# Patient Record
Sex: Female | Born: 1960 | Race: White | Hispanic: No | Marital: Married | Smoking: Never smoker
Health system: Southern US, Community
[De-identification: ages and names within clinical notes are randomized; demographics above are authoritative.]

## PROBLEM LIST (undated history)

## (undated) DIAGNOSIS — C4490 Unspecified malignant neoplasm of skin, unspecified: Secondary | ICD-10-CM

## (undated) DIAGNOSIS — T7840XA Allergy, unspecified, initial encounter: Secondary | ICD-10-CM

## (undated) DIAGNOSIS — G43909 Migraine, unspecified, not intractable, without status migrainosus: Secondary | ICD-10-CM

## (undated) DIAGNOSIS — M810 Age-related osteoporosis without current pathological fracture: Secondary | ICD-10-CM

## (undated) HISTORY — PX: COSMETIC SURGERY: SHX468

## (undated) HISTORY — DX: Allergy, unspecified, initial encounter: T78.40XA

## (undated) HISTORY — DX: Unspecified malignant neoplasm of skin, unspecified: C44.90

## (undated) HISTORY — DX: Migraine, unspecified, not intractable, without status migrainosus: G43.909

## (undated) HISTORY — DX: Age-related osteoporosis without current pathological fracture: M81.0

## (undated) HISTORY — PX: ANKLE SURGERY: SHX546

---

## 2009-01-31 HISTORY — PX: OTHER SURGICAL HISTORY: SHX169

## 2012-01-15 HISTORY — PX: COLONOSCOPY: SHX174

## 2013-05-07 LAB — HEPATIC FUNCTION PANEL
ALT: 20 (ref 7–35)
AST: 23 (ref 13–35)
Alkaline Phosphatase: 84 (ref 25–125)
Bilirubin, Total: 0.4

## 2013-05-07 LAB — COMPREHENSIVE METABOLIC PANEL: GFR calc non Af Amer: 95

## 2013-05-07 LAB — LIPID PANEL
Cholesterol: 137 (ref 0–200)
HDL: 53 (ref 35–70)
LDL Cholesterol: 74
Triglycerides: 50 (ref 40–160)

## 2013-05-07 LAB — BASIC METABOLIC PANEL
BUN: 16 (ref 4–21)
CO2: 26 — AB (ref 13–22)
Chloride: 104 (ref 99–108)
Creatinine: 0.7 (ref 0.5–1.1)
Glucose: 86
Potassium: 4.6 (ref 3.4–5.3)
Sodium: 142 (ref 137–147)

## 2013-05-07 LAB — CBC AND DIFFERENTIAL
HCT: 40 (ref 36–46)
Hemoglobin: 12.8 (ref 12.0–16.0)
Platelets: 276 (ref 150–399)
WBC: 3.8

## 2013-05-07 LAB — CBC: RBC: 4.19 (ref 3.87–5.11)

## 2013-05-07 LAB — TSH: TSH: 2.85 (ref 0.41–5.90)

## 2016-01-01 HISTORY — PX: MENISCUS REPAIR: SHX5179

## 2016-08-16 LAB — LIPID PANEL
Cholesterol: 131 (ref 0–200)
HDL: 59 (ref 35–70)
LDL Cholesterol: 65
TRIGLYCERIDES: 37 — AB (ref 40–160)

## 2016-08-16 LAB — BASIC METABOLIC PANEL
BUN: 15 (ref 4–21)
Creatinine: 0.7 (ref 0.5–1.1)
Glucose: 78
Potassium: 4.5 (ref 3.4–5.3)
SODIUM: 144 (ref 137–147)

## 2016-08-16 LAB — HEPATIC FUNCTION PANEL
ALK PHOS: 88 (ref 25–125)
ALT: 19 (ref 7–35)
AST: 28 (ref 13–35)
BILIRUBIN, TOTAL: 0.6

## 2016-10-04 ENCOUNTER — Ambulatory Visit (INDEPENDENT_AMBULATORY_CARE_PROVIDER_SITE_OTHER): Payer: BLUE CROSS/BLUE SHIELD | Admitting: Internal Medicine

## 2016-10-04 ENCOUNTER — Encounter: Payer: Self-pay | Admitting: Internal Medicine

## 2016-10-04 VITALS — BP 94/58 | HR 65 | Ht 63.0 in | Wt 106.8 lb

## 2016-10-04 DIAGNOSIS — R7303 Prediabetes: Secondary | ICD-10-CM | POA: Insufficient documentation

## 2016-10-04 DIAGNOSIS — S40862D Insect bite (nonvenomous) of left upper arm, subsequent encounter: Secondary | ICD-10-CM

## 2016-10-04 DIAGNOSIS — R739 Hyperglycemia, unspecified: Secondary | ICD-10-CM | POA: Insufficient documentation

## 2016-10-04 DIAGNOSIS — Z85820 Personal history of malignant melanoma of skin: Secondary | ICD-10-CM | POA: Insufficient documentation

## 2016-10-04 DIAGNOSIS — W57XXXD Bitten or stung by nonvenomous insect and other nonvenomous arthropods, subsequent encounter: Secondary | ICD-10-CM

## 2016-10-04 DIAGNOSIS — J3089 Other allergic rhinitis: Secondary | ICD-10-CM | POA: Insufficient documentation

## 2016-10-04 DIAGNOSIS — G43C0 Periodic headache syndromes in child or adult, not intractable: Secondary | ICD-10-CM

## 2016-10-04 DIAGNOSIS — Z1231 Encounter for screening mammogram for malignant neoplasm of breast: Secondary | ICD-10-CM | POA: Diagnosis not present

## 2016-10-04 DIAGNOSIS — M1712 Unilateral primary osteoarthritis, left knee: Secondary | ICD-10-CM | POA: Insufficient documentation

## 2016-10-04 DIAGNOSIS — W57XXXA Bitten or stung by nonvenomous insect and other nonvenomous arthropods, initial encounter: Secondary | ICD-10-CM

## 2016-10-04 DIAGNOSIS — G43009 Migraine without aura, not intractable, without status migrainosus: Secondary | ICD-10-CM | POA: Insufficient documentation

## 2016-10-04 DIAGNOSIS — S40862A Insect bite (nonvenomous) of left upper arm, initial encounter: Secondary | ICD-10-CM | POA: Insufficient documentation

## 2016-10-04 DIAGNOSIS — Z1239 Encounter for other screening for malignant neoplasm of breast: Secondary | ICD-10-CM

## 2016-10-04 HISTORY — DX: Unilateral primary osteoarthritis, left knee: M17.12

## 2016-10-04 MED ORDER — FLUTICASONE PROPIONATE 50 MCG/ACT NA SUSP: 2.0000 | Freq: Every day | NASAL | 5 refills | Status: DC

## 2016-10-04 MED ORDER — ETODOLAC 400 MG PO TABS: 400.0000 mg | ORAL_TABLET | Freq: Every day | ORAL | 5 refills | Status: DC

## 2016-10-04 MED ORDER — RIZATRIPTAN BENZOATE 10 MG PO TABS: 10.0000 mg | ORAL_TABLET | ORAL | 5 refills | Status: DC | PRN

## 2016-10-04 NOTE — Patient Instructions (Addendum)
Schedule appt with Dermatology -  Breast Self-Awareness Breast self-awareness means being familiar with how your breasts look and feel. It involves checking your breasts regularly and reporting any changes to your health care provider. Practicing breast self-awareness is important. A change in your breasts can be a sign of a serious medical problem. Being familiar with how your breasts look and feel allows you to find any problems early, when treatment is more likely to be successful. All women should practice breast self-awareness, including women who have had breast implants. How to do a breast self-exam One way to learn what is normal for your breasts and whether your breasts are changing is to do a breast self-exam. To do a breast self-exam: Look for Changes  1. Remove all the clothing above your waist. 2. Stand in front of a mirror in a room with good lighting. 3. Put your hands on your hips. 4. Push your hands firmly downward. 5. Compare your breasts in the mirror. Look for differences between them (asymmetry), such as: ? Differences in shape. ? Differences in size. ? Puckers, dips, and bumps in one breast and not the other. 6. Look at each breast for changes in your skin, such as: ? Redness. ? Scaly areas. 7. Look for changes in your nipples, such as: ? Discharge. ? Bleeding. ? Dimpling. ? Redness. ? A change in position. Feel for Changes  Carefully feel your breasts for lumps and changes. It is best to do this while lying on your back on the floor and again while sitting or standing in the shower or tub with soapy water on your skin. Feel each breast in the following way:  Place the arm on the side of the breast you are examining above your head.  Feel your breast with the other hand.  Start in the nipple area and make  inch (2 cm) overlapping circles to feel your breast. Use the pads of your three middle fingers to do this. Apply light pressure, then medium pressure, then  firm pressure. The light pressure will allow you to feel the tissue closest to the skin. The medium pressure will allow you to feel the tissue that is a little deeper. The firm pressure will allow you to feel the tissue close to the ribs.  Continue the overlapping circles, moving downward over the breast until you feel your ribs below your breast.  Move one finger-width toward the center of the body. Continue to use the  inch (2 cm) overlapping circles to feel your breast as you move slowly up toward your collarbone.  Continue the up and down exam using all three pressures until you reach your armpit.  Write Down What You Find  Write down what is normal for each breast and any changes that you find. Keep a written record with breast changes or normal findings for each breast. By writing this information down, you do not need to depend only on memory for size, tenderness, or location. Write down where you are in your menstrual cycle, if you are still menstruating. If you are having trouble noticing differences in your breasts, do not get discouraged. With time you will become more familiar with the variations in your breasts and more comfortable with the exam. How often should I examine my breasts? Examine your breasts every month. If you are breastfeeding, the best time to examine your breasts is after a feeding or after using a breast pump. If you menstruate, the best time to examine your  breasts is 5-7 days after your period is over. During your period, your breasts are lumpier, and it may be more difficult to notice changes. When should I see my health care provider? See your health care provider if you notice:  A change in shape or size of your breasts or nipples.  A change in the skin of your breast or nipples, such as a reddened or scaly area.  Unusual discharge from your nipples.  A lump or thick area that was not there before.  Pain in your breasts.  Anything that concerns  you.  This information is not intended to replace advice given to you by your health care provider. Make sure you discuss any questions you have with your health care provider. Document Released: 01/17/2005 Document Revised: 06/25/2015 Document Reviewed: 12/07/2014 Elsevier Interactive Patient Education  [employer].

## 2016-10-04 NOTE — Progress Notes (Signed)
Date:  10/04/2016   Name:  Kristi Murphy   DOB:  [birth year]   MRN:  [id number]   Chief Complaint: Establish Care (Wants to discuss medication. ); Tick Removal (Was bitten in may and april. But every once in a while will have tick on body. Would like lyme test. ); and Hyperglycemia (2 years ago had pre-diabetes. Concerned of diabetes- tingling in hands during sleep. Would like to check this. ) Hyperglycemia  Chronicity: uncertain - was told she was pre diabetic 2 years ago. Associated symptoms include arthralgias (intermittent left knee ache with excessive activity), nausea, numbness (in hands when awakening from sleep) and vomiting. Pertinent negatives include no chest pain, chills, fatigue, fever, headaches, joint swelling, myalgias or sore throat.  Migraine   This is a recurrent problem. The current episode started more than 1 year ago. The problem occurs intermittently (every several months). The pain is located in the right unilateral region. The quality of the pain is described as stabbing and throbbing. Associated symptoms include nausea, numbness (in hands when awakening from sleep) and vomiting. Pertinent negatives include no dizziness, fever or sore throat. She has tried triptans for the symptoms. The treatment provided significant relief.   Tick Bites - she has had a number of tick bites in the past few months. [place] and also thinks her dogs carry them in. The last tick bite was on the back of her left hand she pulled it off yesterday. The area is slightly red and itchy but otherwise she is well. She denies any history of target lesions no diffuse rash myalgia headache or flulike symptoms.  S/p Left Meniscus surgery - done in December in [place]. She's been taking etodolac twice a day as needed. She wonders if there something less expensive that she could use. She has no issues with Aleve or ibuprofen.  Hx of Melanoma - patient had a melanoma removed from the back of her  right arm around 2011. She's been seeing dermatology regularly. Needs to establish with a new dermatologist here.  Breast cancer screening - patient gets mammograms yearly. Last was done in [place] and was borderline. They asked her to return for additional views but she declined due to being out of state already. She denies breast mass tenderness or nipple discharge.  Review of Systems  Constitutional: Negative for chills, fatigue and fever.  HENT: Negative for sore throat and trouble swallowing.   Eyes: Negative for visual disturbance.  Respiratory: Negative for chest tightness, shortness of breath and wheezing.   Cardiovascular: Negative for chest pain, palpitations and leg swelling.  Gastrointestinal: Positive for nausea and vomiting. Negative for blood in stool.  Endocrine: Negative for polydipsia and polyuria.  Genitourinary: Negative for dysuria.  Musculoskeletal: Positive for arthralgias (intermittent left knee ache with excessive activity). Negative for gait problem, joint swelling and myalgias.  Allergic/Immunologic: Positive for environmental allergies.  Neurological: Positive for numbness (in hands when awakening from sleep). Negative for dizziness and headaches.  Psychiatric/Behavioral: Negative for sleep disturbance. The patient is not nervous/anxious.     Patient Active Problem List   Diagnosis Date Noted  . Hyperglycemia 10/04/2016  . Tick bite of left upper arm 10/04/2016  . Hx of melanoma of skin 10/04/2016  . Periodic headache syndrome, not intractable 10/04/2016  . Environmental and seasonal allergies 10/04/2016    Prior to Admission medications   Medication Sig Start Date End Date Taking? Authorizing Provider  etodolac (LODINE) 400 MG tablet Take 400 mg  by mouth daily.   Yes [provider]  fluticasone (FLONASE) 50 MCG/ACT nasal spray Place 2 sprays into both nostrils daily.   Yes [provider]  rizatriptan (MAXALT) 10 MG tablet Take 10 mg by  mouth as needed for migraine. May repeat in 2 hours if needed   Yes [provider]    Allergies  Allergen Reactions  . Clindamycin/Lincomycin Hives    Past Surgical History:  Procedure Laterality Date  . ANKLE SURGERY     fracture  . COLONOSCOPY  01/15/2012   normal - 10 yr follow up  . melanoma Right 2011   removal-   . MENISCUS REPAIR Left 01/2016    Social History  Substance Use Topics  . Smoking status: Never Smoker  . Smokeless tobacco: Never Used  . Alcohol use No     Medication list has been reviewed and updated.  PHQ 2/9 Scores 10/04/2016  PHQ - 2 Score 0    Physical Exam  Constitutional: She is oriented to person, place, and time. She appears well-developed. No distress.  HENT:  Head: Normocephalic and atraumatic.  Cardiovascular: Normal rate, regular rhythm and normal heart sounds.   Pulmonary/Chest: Effort normal and breath sounds normal. No respiratory distress.  Musculoskeletal: Normal range of motion. She exhibits no edema.       Right knee: She exhibits normal range of motion and no effusion.       Left knee: She exhibits normal range of motion, no swelling and no effusion. No tenderness found.  Neurological: She is alert and oriented to person, place, and time.  Skin: Skin is warm and dry. No rash noted. There is erythema.     Psychiatric: She has a normal mood and affect. Her speech is normal and behavior is normal. Thought content normal.  Nursing note and vitals reviewed.   BP (!) 94/58   Pulse 65   Ht 5\' 3"  (1.6 m)   Wt 106 lb 12.8 oz (48.4 kg)   SpO2 97%   BMI 18.92 kg/m   Assessment and Plan: 1. Environmental and seasonal allergies Continue flonase - fluticasone (FLONASE) 50 MCG/ACT nasal spray; Place 2 sprays into both nostrils daily.  Dispense: 16 g; Refill: 5  2. Periodic headache syndrome, not intractable Continue triptans as needed - rizatriptan (MAXALT) 10 MG tablet; Take 1 tablet (10 mg total) by mouth as needed for  migraine. May repeat in 2 hours if needed  Dispense: 12 tablet; Refill: 5  3. Hx of melanoma of skin Schedule with new Dermatologist - Comprehensive metabolic panel - CBC with Differential/Platelet  4. Tick bite of left upper arm, subsequent encounter No evidence of infection but will check labs - B. Burgdorfi Antibodies  5. Breast cancer screening Call for appt - will likely need to request outside films - [facility] DIGITAL SCREENING BILATERAL; Future  6. Hyperglycemia Rule out DM - Hemoglobin A1c  7. Primary osteoarthritis of left knee Continue etodolac Or can substitute Aleve 2 tabs bid PRN  Meds ordered this encounter  Medications  . rizatriptan (MAXALT) 10 MG tablet    Sig: Take 1 tablet (10 mg total) by mouth as needed for migraine. May repeat in 2 hours if needed    Dispense:  12 tablet    Refill:  5  . etodolac (LODINE) 400 MG tablet    Sig: Take 1 tablet (400 mg total) by mouth daily.    Dispense:  30 tablet    Refill:  5  . fluticasone (  FLONASE) 50 MCG/ACT nasal spray    Sig: Place 2 sprays into both nostrils daily.    Dispense:  16 g    Refill:  5    Partially dictated using Editor, commissioning. Any errors are unintentional.  Halina Maidens, MD [facility]  10/04/2016

## 2016-10-05 LAB — COMPREHENSIVE METABOLIC PANEL
A/G RATIO: 1.7 (ref 1.2–2.2)
ALT: 23 IU/L (ref 0–32)
AST: 26 IU/L (ref 0–40)
Albumin: 4 g/dL (ref 3.5–5.5)
Alkaline Phosphatase: 96 IU/L (ref 39–117)
BILIRUBIN TOTAL: 0.2 mg/dL (ref 0.0–1.2)
BUN / CREAT RATIO: 23 (ref 9–23)
BUN: 19 mg/dL (ref 6–24)
CHLORIDE: 101 mmol/L (ref 96–106)
CO2: 26 mmol/L (ref 20–29)
Calcium: 9.4 mg/dL (ref 8.7–10.2)
Creatinine, Ser: 0.81 mg/dL (ref 0.57–1.00)
GFR calc Af Amer: 93 mL/min/{1.73_m2} (ref >59)
GFR calc non Af Amer: 81 mL/min/{1.73_m2} (ref >59)
GLOBULIN, TOTAL: 2.4 g/dL (ref 1.5–4.5)
Glucose: 89 mg/dL (ref 65–99)
POTASSIUM: 4.5 mmol/L (ref 3.5–5.2)
Sodium: 142 mmol/L (ref 134–144)
Total Protein: 6.4 g/dL (ref 6.0–8.5)

## 2016-10-05 LAB — CBC WITH DIFFERENTIAL/PLATELET
BASOS ABS: 0.1 10*3/uL (ref 0.0–0.2)
Basos: 1 %
EOS (ABSOLUTE): 0.1 10*3/uL (ref 0.0–0.4)
Eos: 3 %
Hematocrit: 38.9 % (ref 34.0–46.6)
Hemoglobin: 12.3 g/dL (ref 11.1–15.9)
IMMATURE GRANS (ABS): 0 10*3/uL (ref 0.0–0.1)
Immature Granulocytes: 0 %
LYMPHS: 43 %
Lymphocytes Absolute: 1.9 10*3/uL (ref 0.7–3.1)
MCH: 29.8 pg (ref 26.6–33.0)
MCHC: 31.6 g/dL (ref 31.5–35.7)
MCV: 94 fL (ref 79–97)
MONOS ABS: 0.5 10*3/uL (ref 0.1–0.9)
Monocytes: 12 %
NEUTROS ABS: 1.8 10*3/uL (ref 1.4–7.0)
Neutrophils: 41 %
PLATELETS: 284 10*3/uL (ref 150–379)
RBC: 4.13 x10E6/uL (ref 3.77–5.28)
RDW: 13.5 % (ref 12.3–15.4)
WBC: 4.3 10*3/uL (ref 3.4–10.8)

## 2016-10-05 LAB — HEMOGLOBIN A1C
Est. average glucose Bld gHb Est-mCnc: 111 mg/dL
HEMOGLOBIN A1C: 5.5 % (ref 4.8–5.6)

## 2016-10-05 LAB — B. BURGDORFI ANTIBODIES: Lyme IgG/IgM Ab: <0.91 {ISR} (ref 0.00–0.90)

## 2016-10-24 ENCOUNTER — Ambulatory Visit: Payer: Self-pay

## 2016-10-31 ENCOUNTER — Ambulatory Visit
Admission: RE | Admit: 2016-10-31 | Discharge: 2016-10-31 | Disposition: A | Discharge status: Home / Self Care | Payer: BLUE CROSS/BLUE SHIELD | Source: Ambulatory Visit | Attending: Internal Medicine | Admitting: Internal Medicine

## 2016-10-31 DIAGNOSIS — Z1231 Encounter for screening mammogram for malignant neoplasm of breast: Secondary | ICD-10-CM | POA: Insufficient documentation

## 2016-10-31 DIAGNOSIS — Z1239 Encounter for other screening for malignant neoplasm of breast: Secondary | ICD-10-CM

## 2016-11-11 ENCOUNTER — Inpatient Hospital Stay
Admission: RE | Admit: 2016-11-11 | Discharge: 2016-11-11 | Disposition: A | Discharge status: Home / Self Care | Payer: Self-pay | Source: Ambulatory Visit | Attending: *Deleted | Admitting: *Deleted

## 2016-11-11 ENCOUNTER — Other Ambulatory Visit: Payer: Self-pay | Admitting: *Deleted

## 2016-11-11 DIAGNOSIS — Z9289 Personal history of other medical treatment: Secondary | ICD-10-CM

## 2017-03-24 ENCOUNTER — Encounter: Payer: Self-pay | Admitting: Internal Medicine

## 2017-03-24 ENCOUNTER — Ambulatory Visit (INDEPENDENT_AMBULATORY_CARE_PROVIDER_SITE_OTHER): Payer: 59 | Admitting: Internal Medicine

## 2017-03-24 VITALS — BP 86/58 | HR 60 | Temp 98.1°F | Ht 63.0 in | Wt 109.0 lb

## 2017-03-24 DIAGNOSIS — J069 Acute upper respiratory infection, unspecified: Secondary | ICD-10-CM | POA: Diagnosis not present

## 2017-03-24 MED ORDER — BENZONATATE 100 MG PO CAPS: 100.0000 mg | ORAL_CAPSULE | Freq: Two times a day (BID) | ORAL | 0 refills | Status: DC | PRN

## 2017-03-24 NOTE — Patient Instructions (Signed)
Allegra (fexafenadine) or Claritin (loratidine) - daily for drainage  Advil or Tylenol several times per day

## 2017-03-24 NOTE — Progress Notes (Signed)
Date:  03/24/2017   Name:  Kristi Murphy   DOB:  1960/10/26   MRN:  [id number]   Chief Complaint: Cough (X 1 week. Coughing- no production. No voice for 2 days. Chest and throat congestion. Chills and headache. No body aches.)  Cough  This is a new problem. The current episode started in the past 7 days. The problem occurs every few minutes. The cough is non-productive. Associated symptoms include chills, headaches, a sore throat and shortness of breath. Pertinent negatives include no chest pain, ear pain, fever or wheezing.  Sx started 5 days ago with sore throat but no fever for chills.  She then developed a dry cough that kept her from sleep.  She has been taking tylenol and sudafed.   Review of Systems  Constitutional: Positive for chills. Negative for fever.  HENT: Positive for sore throat. Negative for congestion, ear pain and sinus pressure.   Respiratory: Positive for cough and shortness of breath. Negative for chest tightness and wheezing.   Cardiovascular: Negative for chest pain and palpitations.  Gastrointestinal: Negative for abdominal pain, diarrhea and vomiting.  Neurological: Positive for headaches. Negative for dizziness.    Patient Active Problem List   Diagnosis Date Noted  . Hyperglycemia 10/04/2016  . Tick bite of left upper arm 10/04/2016  . Hx of melanoma of skin 10/04/2016  . Periodic headache syndrome, not intractable 10/04/2016  . Environmental and seasonal allergies 10/04/2016  . Primary osteoarthritis of left knee 10/04/2016    Prior to Admission medications   Medication Sig Start Date End Date Taking? Authorizing Provider  etodolac (LODINE) 400 MG tablet Take 1 tablet (400 mg total) by mouth daily. 10/04/16  Yes Glean Hess, MD  fluticasone [facility]) 50 MCG/ACT nasal spray Place 2 sprays into both nostrils daily. 10/04/16  Yes Glean Hess, MD  rizatriptan (MAXALT) 10 MG tablet Take 1 tablet (10 mg total) by mouth as needed for migraine. May  repeat in 2 hours if needed 10/04/16  Yes Glean Hess, MD    Allergies  Allergen Reactions  . Clindamycin/Lincomycin Hives    Past Surgical History:  Procedure Laterality Date  . ANKLE SURGERY     fracture  . COLONOSCOPY  01/15/2012   normal - 10 yr follow up  . melanoma Right 2011   removal-   . MENISCUS REPAIR Left 01/2016    Social History   Tobacco Use  . Smoking status: Never Smoker  . Smokeless tobacco: Never Used  Substance Use Topics  . Alcohol use: No  . Drug use: No     Medication list has been reviewed and updated.  PHQ 2/9 Scores 10/04/2016  PHQ - 2 Score 0    Physical Exam  Constitutional: She is oriented to person, place, and time. She appears well-developed. No distress.  HENT:  Head: Normocephalic and atraumatic.  Right Ear: Tympanic membrane and ear canal normal.  Left Ear: Tympanic membrane and ear canal normal.  Nose: Right sinus exhibits no maxillary sinus tenderness and no frontal sinus tenderness. Left sinus exhibits no maxillary sinus tenderness and no frontal sinus tenderness.  Mouth/Throat: Posterior oropharyngeal edema and posterior oropharyngeal erythema present.  Cardiovascular: Normal rate, regular rhythm and normal heart sounds.  Pulmonary/Chest: Effort normal and breath sounds normal. No respiratory distress. She has no wheezes.  Musculoskeletal: Normal range of motion.  Neurological: She is alert and oriented to person, place, and time.  Skin: Skin is warm and dry. No rash noted.  Psychiatric: She has a normal mood and affect. Her behavior is normal. Thought content normal.  Nursing note and vitals reviewed.   BP (!) 86/58   Pulse 60   Temp 98.1 F (36.7 C) (Oral)   Ht 5\' 3"  (1.6 m)   Wt 109 lb (49.4 kg)   SpO2 97%   BMI 19.31 kg/m   Assessment and Plan: 1. Viral URI Continue Tylenol Stop sudafed and begin Allegra or Claritin She declines robitussin with codeine - benzonatate (TESSALON) 100 MG capsule; Take 1  capsule (100 mg total) by mouth 2 (two) times daily as needed for cough.  Dispense: 30 capsule; Refill: 0   Meds ordered this encounter  Medications  . benzonatate (TESSALON) 100 MG capsule    Sig: Take 1 capsule (100 mg total) by mouth 2 (two) times daily as needed for cough.    Dispense:  30 capsule    Refill:  0    Partially dictated using Editor, commissioning. Any errors are unintentional.  Halina Maidens, MD [facility]  03/24/2017

## 2017-03-27 ENCOUNTER — Other Ambulatory Visit: Payer: Self-pay | Admitting: Internal Medicine

## 2017-03-27 ENCOUNTER — Telehealth: Payer: Self-pay

## 2017-03-27 MED ORDER — GUAIFENESIN-CODEINE 100-10 MG/5ML PO SYRP: 5.0000 mL | ORAL_SOLUTION | Freq: Three times a day (TID) | ORAL | 0 refills | Status: DC | PRN

## 2017-03-27 MED ORDER — DOXYCYCLINE HYCLATE 100 MG PO TABS: 100.0000 mg | ORAL_TABLET | Freq: Two times a day (BID) | ORAL | 0 refills | Status: DC

## 2017-03-27 NOTE — Telephone Encounter (Signed)
Patient called stating she was seen Friday for Viral URI. Was told to call Monday if gets worse or mucous changes colors. She stated her mucous is now thick green and her cough is much worse- especially at nighttime. Wants to know she can now have antibiotics and something stronger for her cough.  Please Advise.

## 2017-03-27 NOTE — Telephone Encounter (Signed)
Patient informed. 

## 2017-03-27 NOTE — Telephone Encounter (Signed)
Send antibiotics and cough medication to pharmacy (Rxs printed)

## 2017-04-05 ENCOUNTER — Encounter: Payer: Self-pay | Admitting: Internal Medicine

## 2017-04-06 ENCOUNTER — Ambulatory Visit (INDEPENDENT_AMBULATORY_CARE_PROVIDER_SITE_OTHER): Payer: 59 | Admitting: Internal Medicine

## 2017-04-06 ENCOUNTER — Encounter: Payer: Self-pay | Admitting: Internal Medicine

## 2017-04-06 VITALS — BP 108/64 | HR 61 | Ht 63.0 in | Wt 108.6 lb

## 2017-04-06 DIAGNOSIS — J3089 Other allergic rhinitis: Secondary | ICD-10-CM

## 2017-04-06 DIAGNOSIS — G43C0 Periodic headache syndromes in child or adult, not intractable: Secondary | ICD-10-CM | POA: Diagnosis not present

## 2017-04-06 DIAGNOSIS — Z1159 Encounter for screening for other viral diseases: Secondary | ICD-10-CM | POA: Diagnosis not present

## 2017-04-06 DIAGNOSIS — M67471 Ganglion, right ankle and foot: Secondary | ICD-10-CM

## 2017-04-06 DIAGNOSIS — Z0000 Encounter for general adult medical examination without abnormal findings: Secondary | ICD-10-CM

## 2017-04-06 DIAGNOSIS — Z0001 Encounter for general adult medical examination with abnormal findings: Secondary | ICD-10-CM | POA: Diagnosis not present

## 2017-04-06 DIAGNOSIS — Z1239 Encounter for other screening for malignant neoplasm of breast: Secondary | ICD-10-CM

## 2017-04-06 LAB — POCT URINALYSIS DIPSTICK
Bilirubin, UA: NEGATIVE
Blood, UA: NEGATIVE
Glucose, UA: NEGATIVE
Ketones, UA: NEGATIVE
Leukocytes, UA: NEGATIVE
Nitrite, UA: NEGATIVE
Protein, UA: NEGATIVE
Spec Grav, UA: 1.015
Urobilinogen, UA: 0.2 U/dL
pH, UA: 6.5

## 2017-04-06 MED ORDER — ZOSTER VAC RECOMB ADJUVANTED 50 MCG/0.5ML IM SUSR: 0.5000 mL | Freq: Once | INTRAMUSCULAR | 1 refills | Status: AC

## 2017-04-06 MED ORDER — FLUTICASONE PROPIONATE 50 MCG/ACT NA SUSP: 2.0000 | Freq: Every day | NASAL | 12 refills | Status: DC

## 2017-04-06 MED ORDER — RIZATRIPTAN BENZOATE 10 MG PO TABS: 10.0000 mg | ORAL_TABLET | ORAL | 12 refills | Status: DC | PRN

## 2017-04-06 NOTE — Progress Notes (Signed)
Date:  04/06/2017   Name:  Kristi Murphy   DOB:  27-Jul-1960   MRN:  [id number]   Chief Complaint: Annual Exam (Breast Exam.) Kristi Murphy is a 57 y.o. female who presents today for her Complete Annual Exam. She feels well. She reports exercising some. She reports she is sleeping well.  Mammogram was done in 10/2016. She is due for colonoscopy and Hep C testing.  Migraine - intermittent and unchanged.  Relieved by maxalt.  Allergic rhinits - continue Flonase as needed.  She has almost completely recovered from her URI.  Has mild cough at night but otherwise asx.  Review of Systems  Constitutional: Negative for chills, fatigue and fever.  HENT: Negative for congestion, hearing loss, tinnitus, trouble swallowing and voice change.   Eyes: Negative for visual disturbance.  Respiratory: Positive for cough. Negative for chest tightness, shortness of breath and wheezing.   Cardiovascular: Negative for chest pain, palpitations and leg swelling.  Gastrointestinal: Negative for abdominal pain, constipation, diarrhea and vomiting.  Endocrine: Negative for polydipsia and polyuria.  Genitourinary: Negative for dysuria, frequency, genital sores, vaginal bleeding and vaginal discharge.  Musculoskeletal: Positive for arthralgias (right ankle mass and chronic knee OA). Negative for gait problem and joint swelling.  Skin: Negative for color change and rash.  Neurological: Negative for dizziness, tremors, light-headedness and headaches.  Hematological: Negative for adenopathy. Does not bruise/bleed easily.  Psychiatric/Behavioral: Negative for dysphoric mood and sleep disturbance. The patient is not nervous/anxious.     Patient Active Problem List   Diagnosis Date Noted  . Hyperglycemia 10/04/2016  . Tick bite of left upper arm 10/04/2016  . Hx of melanoma of skin 10/04/2016  . Periodic headache syndrome, not intractable 10/04/2016  . Environmental and seasonal allergies 10/04/2016  . Primary  osteoarthritis of left knee 10/04/2016    Prior to Admission medications   Medication Sig Start Date End Date Taking? Authorizing Provider  etodolac (LODINE) 400 MG tablet Take 1 tablet (400 mg total) by mouth daily. 10/04/16  Yes Glean Hess, MD  fluticasone [facility]) 50 MCG/ACT nasal spray Place 2 sprays into both nostrils daily. 10/04/16  Yes Glean Hess, MD  rizatriptan (MAXALT) 10 MG tablet Take 1 tablet (10 mg total) by mouth as needed for migraine. May repeat in 2 hours if needed 10/04/16  Yes Glean Hess, MD    Allergies  Allergen Reactions  . Clindamycin/Lincomycin Hives    Past Surgical History:  Procedure Laterality Date  . ANKLE SURGERY     fracture  . COLONOSCOPY  01/15/2012   normal - 10 yr follow up  . melanoma Right 2011   removal-   . MENISCUS REPAIR Left 01/2016    Social History   Tobacco Use  . Smoking status: Never Smoker  . Smokeless tobacco: Never Used  Substance Use Topics  . Alcohol use: No  . Drug use: No     Medication list has been reviewed and updated.  PHQ 2/9 Scores 04/06/2017 10/04/2016  PHQ - 2 Score 0 0    Physical Exam  Constitutional: She is oriented to person, place, and time. She appears well-developed and well-nourished. No distress.  HENT:  Head: Normocephalic and atraumatic.  Right Ear: Ear canal normal. Tympanic membrane is retracted.  Left Ear: Tympanic membrane and ear canal normal.  Nose: Right sinus exhibits no maxillary sinus tenderness. Left sinus exhibits no maxillary sinus tenderness.  Mouth/Throat: Uvula is midline and oropharynx is clear and moist.  Eyes: Conjunctivae  and EOM are normal. Right eye exhibits no discharge. Left eye exhibits no discharge. No scleral icterus.  Neck: Normal range of motion. Carotid bruit is not present. No erythema present. No thyromegaly present.  Cardiovascular: Normal rate, regular rhythm, normal heart sounds and normal pulses.  Pulmonary/Chest: Effort normal. No  respiratory distress. She has no wheezes. Right breast exhibits no mass, no nipple discharge, no skin change and no tenderness. Left breast exhibits no mass, no nipple discharge, no skin change and no tenderness.  Abdominal: Soft. Bowel sounds are normal. There is no hepatosplenomegaly. There is no tenderness. There is no CVA tenderness.  Musculoskeletal: Normal range of motion.       Feet:  Lymphadenopathy:    She has no cervical adenopathy.    She has no axillary adenopathy.  Neurological: She is alert and oriented to person, place, and time. She has normal reflexes. No cranial nerve deficit or sensory deficit.  Skin: Skin is warm, dry and intact. No rash noted.  Psychiatric: She has a normal mood and affect. Her speech is normal and behavior is normal. Thought content normal.  Nursing note and vitals reviewed.   BP 108/64   Pulse 61   Ht 5\' 3"  (1.6 m)   Wt 108 lb 9.6 oz (49.3 kg)   SpO2 99%   BMI 19.24 kg/m   Assessment and Plan: 1. Annual physical exam Normal exam Mammogram and colonoscopy up to date - Comprehensive metabolic panel - CBC with Differential/Platelet - Lipid panel - TSH - POCT urinalysis dipstick  2. Breast cancer screening done  3. Periodic headache syndrome, not intractable Continue maxalt PRN - rizatriptan (MAXALT) 10 MG tablet; Take 1 tablet (10 mg total) by mouth as needed for migraine. May repeat in 2 hours if needed  Dispense: 12 tablet; Refill: 12  4. Environmental and seasonal allergies controlled - fluticasone (FLONASE) 50 MCG/ACT nasal spray; Place 2 sprays into both nostrils daily.  Dispense: 16 g; Refill: 12  5. Need for hepatitis C screening test - Hepatitis C antibody  6. Ganglion of right ankle Pt reassured   Meds ordered this encounter  Medications  . Zoster Vaccine Adjuvanted [facility]) injection    Sig: Inject 0.5 mLs into the muscle once for 1 dose.    Dispense:  0.5 mL    Refill:  1  . rizatriptan (MAXALT) 10 MG tablet     Sig: Take 1 tablet (10 mg total) by mouth as needed for migraine. May repeat in 2 hours if needed    Dispense:  12 tablet    Refill:  12  . fluticasone (FLONASE) 50 MCG/ACT nasal spray    Sig: Place 2 sprays into both nostrils daily.    Dispense:  16 g    Refill:  12    Partially dictated using Editor, commissioning. Any errors are unintentional.  Halina Maidens, MD [facility]  04/06/2017

## 2017-04-07 LAB — LIPID PANEL
CHOLESTEROL TOTAL: 138 mg/dL (ref 100–199)
Chol/HDL Ratio: 3 ratio (ref 0.0–4.4)
HDL: 46 mg/dL (ref >39)
LDL CALC: 84 mg/dL (ref 0–99)
TRIGLYCERIDES: 38 mg/dL (ref 0–149)
VLDL CHOLESTEROL CAL: 8 mg/dL (ref 5–40)

## 2017-04-07 LAB — COMPREHENSIVE METABOLIC PANEL
A/G RATIO: 1.3 (ref 1.2–2.2)
ALT: 22 IU/L (ref 0–32)
AST: 27 IU/L (ref 0–40)
Albumin: 3.7 g/dL (ref 3.5–5.5)
Alkaline Phosphatase: 107 IU/L (ref 39–117)
BILIRUBIN TOTAL: 0.3 mg/dL (ref 0.0–1.2)
BUN / CREAT RATIO: 20 (ref 9–23)
BUN: 15 mg/dL (ref 6–24)
CHLORIDE: 105 mmol/L (ref 96–106)
CO2: 25 mmol/L (ref 20–29)
Calcium: 9.5 mg/dL (ref 8.7–10.2)
Creatinine, Ser: 0.76 mg/dL (ref 0.57–1.00)
GFR calc non Af Amer: 87 mL/min/{1.73_m2} (ref >59)
GFR, EST AFRICAN AMERICAN: 100 mL/min/{1.73_m2} (ref >59)
Globulin, Total: 2.8 g/dL (ref 1.5–4.5)
Glucose: 83 mg/dL (ref 65–99)
POTASSIUM: 4.5 mmol/L (ref 3.5–5.2)
SODIUM: 142 mmol/L (ref 134–144)
TOTAL PROTEIN: 6.5 g/dL (ref 6.0–8.5)

## 2017-04-07 LAB — CBC WITH DIFFERENTIAL/PLATELET
Basophils Absolute: 0 10*3/uL (ref 0.0–0.2)
Basos: 1 %
EOS (ABSOLUTE): 0.1 10*3/uL (ref 0.0–0.4)
Eos: 2 %
HEMATOCRIT: 36.5 % (ref 34.0–46.6)
HEMOGLOBIN: 12.1 g/dL (ref 11.1–15.9)
IMMATURE GRANS (ABS): 0 10*3/uL (ref 0.0–0.1)
IMMATURE GRANULOCYTES: 0 %
LYMPHS: 33 %
Lymphocytes Absolute: 1.6 10*3/uL (ref 0.7–3.1)
MCH: 29.8 pg (ref 26.6–33.0)
MCHC: 33.2 g/dL (ref 31.5–35.7)
MCV: 90 fL (ref 79–97)
Monocytes Absolute: 0.4 10*3/uL (ref 0.1–0.9)
Monocytes: 8 %
Neutrophils Absolute: 2.7 10*3/uL (ref 1.4–7.0)
Neutrophils: 56 %
Platelets: 390 10*3/uL — ABNORMAL HIGH (ref 150–379)
RBC: 4.06 x10E6/uL (ref 3.77–5.28)
RDW: 12.9 % (ref 12.3–15.4)
WBC: 4.9 10*3/uL (ref 3.4–10.8)

## 2017-04-07 LAB — HEPATITIS C ANTIBODY: Hep C Virus Ab: <0.1 s/co ratio (ref 0.0–0.9)

## 2017-04-07 LAB — TSH: TSH: 2.38 u[IU]/mL (ref 0.450–4.500)

## 2017-10-04 DIAGNOSIS — S0501XA Injury of conjunctiva and corneal abrasion without foreign body, right eye, initial encounter: Secondary | ICD-10-CM | POA: Diagnosis not present

## 2017-10-05 ENCOUNTER — Other Ambulatory Visit: Payer: Self-pay | Admitting: Internal Medicine

## 2017-10-05 DIAGNOSIS — Z1231 Encounter for screening mammogram for malignant neoplasm of breast: Secondary | ICD-10-CM

## 2017-10-05 DIAGNOSIS — S0501XD Injury of conjunctiva and corneal abrasion without foreign body, right eye, subsequent encounter: Secondary | ICD-10-CM | POA: Diagnosis not present

## 2017-10-09 DIAGNOSIS — S0501XD Injury of conjunctiva and corneal abrasion without foreign body, right eye, subsequent encounter: Secondary | ICD-10-CM | POA: Diagnosis not present

## 2017-11-01 ENCOUNTER — Ambulatory Visit
Admission: RE | Admit: 2017-11-01 | Discharge: 2017-11-01 | Disposition: A | Discharge status: Home / Self Care | Payer: 59 | Source: Ambulatory Visit | Attending: Internal Medicine | Admitting: Internal Medicine

## 2017-11-01 ENCOUNTER — Encounter: Payer: Self-pay | Admitting: Radiology

## 2017-11-01 DIAGNOSIS — Z1231 Encounter for screening mammogram for malignant neoplasm of breast: Secondary | ICD-10-CM

## 2017-12-12 ENCOUNTER — Ambulatory Visit (INDEPENDENT_AMBULATORY_CARE_PROVIDER_SITE_OTHER): Payer: 59 | Admitting: Internal Medicine

## 2017-12-12 ENCOUNTER — Encounter: Payer: Self-pay | Admitting: Internal Medicine

## 2017-12-12 VITALS — BP 98/58 | HR 62 | Ht 63.0 in | Wt 110.0 lb

## 2017-12-12 DIAGNOSIS — L03115 Cellulitis of right lower limb: Secondary | ICD-10-CM | POA: Diagnosis not present

## 2017-12-12 MED ORDER — SULFAMETHOXAZOLE-TRIMETHOPRIM 800-160 MG PO TABS: 1.0000 | ORAL_TABLET | Freq: Two times a day (BID) | ORAL | 0 refills | Status: AC

## 2017-12-12 NOTE — Progress Notes (Signed)
    Date:  12/12/2017   Name:  Kristi Murphy   DOB:  11-18-1960   MRN:  [id number]   Chief Complaint: Leg Pain (X 1 week. Swollen calf and ankle- RIGHT. Painful- mostly at night time. Hot to touch. No injury or falls. )  Leg Pain   There was no injury mechanism (started 10 days ago after return from a visit to [place]). Pain location: right anterior lower leg. The pain is mild. She reports no foreign bodies present. The symptoms are aggravated by palpation. She has tried acetaminophen and NSAIDs for the symptoms. The treatment provided mild relief.    Review of Systems  Constitutional: Negative for chills, fatigue, fever and unexpected weight change.  Respiratory: Negative for choking, shortness of breath and wheezing.   Cardiovascular: Positive for leg swelling. Negative for chest pain and palpitations.  Skin: Positive for color change and rash.    Patient Active Problem List   Diagnosis Date Noted  . Ganglion of right ankle 04/06/2017  . Hyperglycemia 10/04/2016  . Tick bite of left upper arm 10/04/2016  . Hx of melanoma of skin 10/04/2016  . Periodic headache syndrome, not intractable 10/04/2016  . Environmental and seasonal allergies 10/04/2016  . Primary osteoarthritis of left knee 10/04/2016    Allergies  Allergen Reactions  . Clindamycin/Lincomycin Hives    Past Surgical History:  Procedure Laterality Date  . ANKLE SURGERY     fracture  . COLONOSCOPY  01/15/2012   normal - 10 yr follow up  . melanoma Right 2011   removal-   . MENISCUS REPAIR Left 01/2016    Social History   Tobacco Use  . Smoking status: Never Smoker  . Smokeless tobacco: Never Used  Substance Use Topics  . Alcohol use: No  . Drug use: No     Medication list has been reviewed and updated.  Current Meds  Medication Sig  . fluticasone (FLONASE) 50 MCG/ACT nasal spray Place 2 sprays into both nostrils daily.  . rizatriptan (MAXALT) 10 MG tablet Take 1 tablet (10 mg total) by mouth as needed  for migraine. May repeat in 2 hours if needed    [facility] 2/9 Scores 04/06/2017 10/04/2016  PHQ - 2 Score 0 0    Physical Exam  Constitutional: She is oriented to person, place, and time. She appears well-developed. No distress.  HENT:  Head: Normocephalic and atraumatic.  Cardiovascular: Normal rate, regular rhythm and normal heart sounds.  Pulmonary/Chest: Effort normal and breath sounds normal. No respiratory distress.  Musculoskeletal: Normal range of motion.  Neurological: She is alert and oriented to person, place, and time.  Skin: Skin is warm and dry. No rash noted.  Psychiatric: She has a normal mood and affect. Her behavior is normal. Thought content normal.  Nursing note and vitals reviewed.   BP (!) 98/58 (BP Location: Right Arm, Patient Position: Sitting, Cuff Size: Normal)   Pulse 62   Ht 5\' 3"  (1.6 m)   Wt 110 lb (49.9 kg)   SpO2 99%   BMI 19.49 kg/m   Assessment and Plan: 1. Cellulitis of right lower extremity Elevate and take tylenol or advil for pain - sulfamethoxazole-trimethoprim (BACTRIM DS,SEPTRA DS) 800-160 MG tablet; Take 1 tablet by mouth 2 (two) times daily for 10 days.  Dispense: 20 tablet; Refill: 0   Partially dictated using Editor, commissioning. Any errors are unintentional.  Halina Maidens, MD [facility]  12/12/2017

## 2017-12-19 DIAGNOSIS — H40003 Preglaucoma, unspecified, bilateral: Secondary | ICD-10-CM | POA: Diagnosis not present

## 2017-12-29 ENCOUNTER — Other Ambulatory Visit: Payer: Self-pay

## 2017-12-29 ENCOUNTER — Ambulatory Visit (INDEPENDENT_AMBULATORY_CARE_PROVIDER_SITE_OTHER): Payer: 59

## 2017-12-29 ENCOUNTER — Ambulatory Visit
Admission: EM | Admit: 2017-12-29 | Discharge: 2017-12-29 | Disposition: A | Discharge status: Home / Self Care | Payer: 59 | Attending: Emergency Medicine | Admitting: Emergency Medicine

## 2017-12-29 ENCOUNTER — Encounter: Payer: Self-pay | Admitting: Emergency Medicine

## 2017-12-29 DIAGNOSIS — M25571 Pain in right ankle and joints of right foot: Secondary | ICD-10-CM

## 2017-12-29 DIAGNOSIS — M19071 Primary osteoarthritis, right ankle and foot: Secondary | ICD-10-CM | POA: Diagnosis not present

## 2017-12-29 MED ORDER — PREDNISONE 20 MG PO TABS: 40.0000 mg | ORAL_TABLET | Freq: Every day | ORAL | 0 refills | Status: AC

## 2017-12-29 NOTE — ED Provider Notes (Signed)
HPI  SUBJECTIVE:  Kristi Murphy is a 57 y.o. female who presents with 2 days of sharp, achy, intermittent medial right ankle pain immediately inferior and posterior to the medial malleolus.  Mild swelling in this area.  States that it is present with weightbearing.  No erythema, trauma, fall.  No change in physical activity.  No bruising, fevers, body aches.  No limitation of motion of the ankle.  She has tried Tylenol and 400 mg of ibuprofen, rest, soaking, and wearing an ASO.  The ASO helps.  Symptoms are worse with plantarflexion, weightbearing, inversion/eversion.  It is not associated with moving her toes or dorsiflexion.  No antipyretic in the past 4 to 6 hours.  Patient has a past medical history of right ankle fracture status post ORIF and hardware removal.  She also has a history of a ganglion cyst of her right ankle.  No history of diabetes, hypertension, septic joint, gout, immunocompromise.  [id number], Jesse Sans, MD   Patient was seen by her PMD on 10/12, thought to have a cellulitis to the anterior lower leg, and was treated with 10 days of Bactrim.  States that this has resolved completely.  Past Medical History:  Diagnosis Date  . Allergy    seasonal  . Migraines   . Skin cancer     Past Surgical History:  Procedure Laterality Date  . ANKLE SURGERY     fracture  . COLONOSCOPY  01/15/2012   normal - 10 yr follow up  . melanoma Right 2011   removal-   . MENISCUS REPAIR Left 01/2016    Family History  Problem Relation Age of Onset  . Skin cancer Mother   . Diabetes Mother   . Leukemia Father   . Skin cancer Father   . Heart disease Father   . Diabetes Sister   . Breast cancer Neg Hx     Social History   Tobacco Use  . Smoking status: Never Smoker  . Smokeless tobacco: Never Used  Substance Use Topics  . Alcohol use: No  . Drug use: No    No current facility-administered medications for this encounter.   Current Outpatient Medications:  .  rizatriptan  (MAXALT) 10 MG tablet, Take 1 tablet (10 mg total) by mouth as needed for migraine. May repeat in 2 hours if needed, Disp: 12 tablet, Rfl: 12 .  fluticasone (FLONASE) 50 MCG/ACT nasal spray, Place 2 sprays into both nostrils daily., Disp: 16 g, Rfl: 12 .  predniSONE (DELTASONE) 20 MG tablet, Take 2 tablets (40 mg total) by mouth daily with breakfast for 5 days., Disp: 10 tablet, Rfl: 0  Allergies  Allergen Reactions  . Clindamycin/Lincomycin Hives     ROS  As noted in HPI.   Physical Exam  BP (!) 106/54 (BP Location: Left Arm)   Pulse 60   Temp 98.2 F (36.8 C) (Oral)   Resp 16   Ht 5\' 3"  (1.6 m)   Wt 48.1 kg   SpO2 100%   BMI 18.78 kg/m   Constitutional: Well developed, well nourished, no acute distress Eyes:  EOMI, conjunctiva normal bilaterally HENT: Normocephalic, atraumatic,mucus membranes moist Respiratory: Normal inspiratory effort Cardiovascular: Normal rate GI: nondistended skin: No rash, skin intact Musculoskeletal: R  Ankle: No erythema.  Old surgical scar over medial malleolus.  No limitation of ankle range of motion.  Positive mild swelling inferior to medial malleolus.  No tenderness posterior to the medial malleolus., Medial malleolus NT,  Deltoid ligament medially NT ,  Lateral ligaments NT,   Achilles NT, calcaneus NT,  Proximal 5th metatarsal NT, Midfoot NT, distal NVI with baseline sensation / motor to foot.  Patient able to move toes without any problem.  DP 2+.  Pain with plantarflexion, inversion, eversion.  No pain with dorsiflexion. - bruising. - squeeze test. Pt able to bear weight in dept.  No erythema anterior right lower extremity where patient states cellulitis was. Neurologic: Alert & oriented x 3, no focal neuro deficits Psychiatric: Speech and behavior appropriate   ED Course   Medications - No data to display  Orders Placed This Encounter  Procedures  . DG Ankle Complete Right    Standing Status:   Standing    Number of Occurrences:    1    Order Specific Question:   Reason for Exam (SYMPTOM  OR DIAGNOSIS REQUIRED)    Answer:   Welling, tenderness inferior to the medial malleolus, history of ORIF status post hardware removal.  Evaluate for acute changes.    No results found for this or any previous visit (from the past 24 hour(s)). Dg Ankle Complete Right  Result Date: 12/29/2017 CLINICAL DATA:  57 year old female with a history of skin infection right ankle EXAM: RIGHT ANKLE - COMPLETE 3+ VIEW COMPARISON:  None. FINDINGS: No acute displaced fracture. No sclerotic changes. No periosteal reaction. No erosive changes. Degenerative changes of the hindfoot. Enthesopathic changes at the Achilles insertion. IMPRESSION: Negative for acute bony abnormality Electronically Signed   By: Corrie Mckusick D.O.   On: 12/29/2017 11:11    ED Clinical Impression  Acute right ankle pain   ED Assessment/Plan  Outside records reviewed.  As noted in HPI.  Cellulitis that she was recently treated for appears to have completely resolved. We will get x-rays to rule out any acute changes given surgical history.  There is no evidence of a septic joint, cellulitis.  Suspect tendinitis.  Imaging independently reviewed.  No acute changes.  See radiology report for details.  Patient with ankle pain, suspect tendinitis. will have patient continue 400 mg ibuprofen combined with 1 g of Tylenol together 3 or 4 times a day as needed for pain, soak foot as needed, elevate, she has an ASO, she can wear for the next several days, and will send home with prednisone 40 mg for 5 days.  Follow-up with [facility] in 5 to 7 days if not getting any better.  To the ER for any signs of septic joint.    Discussed imaging, MDM, treatment plan, and plan for follow-up with patient. Discussed sn/sx that should prompt return to the ED. patient agrees with plan.   Meds ordered this encounter  Medications  . predniSONE (DELTASONE) 20 MG tablet    Sig: Take 2 tablets  (40 mg total) by mouth daily with breakfast for 5 days.    Dispense:  10 tablet    Refill:  0    *This clinic note was created using [employer]. Therefore, there may be occasional mistakes despite careful proofreading.   ?    Melynda Ripple, MD 12/29/17 [phone]

## 2017-12-29 NOTE — Discharge Instructions (Addendum)
X-ray was negative for any acute changes-no fracture.  Continue 400 mg ibuprofen combined with 1 g of Tylenol together 3 or 4 times a day as needed for pain, soak foot as needed, elevate, wear your ASO for the next several days. prednisone 40 mg for 5 days.  Follow-up with [facility] in 5 to 7 days if not getting any better.  Go immediately to the ER for fevers above 100.4, if your ankle turns red, hot, swollen, if you are unable to move your ankle due to pain, or for any other concerns.

## 2017-12-29 NOTE — ED Triage Notes (Signed)
Patient states that she recently had a skin infection in her right ankle that was treated a week ago.  Patient states for the past 2 days she has had pain in her right ankle.  Patient denies an injury or fall.

## 2018-01-08 ENCOUNTER — Encounter: Payer: Self-pay | Admitting: Internal Medicine

## 2018-01-08 ENCOUNTER — Ambulatory Visit (INDEPENDENT_AMBULATORY_CARE_PROVIDER_SITE_OTHER): Payer: 59 | Admitting: Internal Medicine

## 2018-01-08 VITALS — BP 130/78 | HR 73 | Ht 63.0 in | Wt 106.0 lb

## 2018-01-08 DIAGNOSIS — S76212A Strain of adductor muscle, fascia and tendon of left thigh, initial encounter: Secondary | ICD-10-CM

## 2018-01-08 MED ORDER — PREDNISONE 10 MG PO TABS: 10.0000 mg | ORAL_TABLET | ORAL | 0 refills | Status: AC

## 2018-01-08 NOTE — Progress Notes (Signed)
Date:  01/08/2018   Name:  Kristi Murphy   DOB:  [birth year]   MRN:  [id number]   Chief Complaint: Groin Pain (Left sided. Was outside playing with dogs Saturday and having pains now. Difficult to stand, sit and walk. Have Iced it and took 2 aleve this morning. Has helped. )  Groin Pain  This is a new problem. The current episode started in the past 7 days. The problem occurs constantly. The problem has been gradually improving. The pain is moderate. The problem affects both sides. Pertinent negatives include no chills or headaches. She has tried NSAIDs for the symptoms. The treatment provided mild relief.  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  She hurt the right groin two weeks ago but that improved with Advil.  Two days ago, she injured her left side.  It does not hurt to sit or stand, but hurts to walk.  This AM she took 2 Aleve and it has helped.  Her main concern is that she is travelling to [facility] for a competition this week.  Review of Systems  Constitutional: Negative for chills and fatigue.  Respiratory: Negative for shortness of breath.   Cardiovascular: Negative for leg swelling.  Musculoskeletal: Positive for gait problem and myalgias. Negative for joint swelling.  Neurological: Negative for dizziness and headaches.    Patient Active Problem List   Diagnosis Date Noted  . Ganglion of right ankle 04/06/2017  . Hyperglycemia 10/04/2016  . Tick bite of left upper arm 10/04/2016  . Hx of melanoma of skin 10/04/2016  . Periodic headache syndrome, not intractable 10/04/2016  . Environmental and seasonal allergies 10/04/2016  . Primary osteoarthritis of left knee 10/04/2016    Allergies  Allergen Reactions  . Clindamycin/Lincomycin Hives    Past Surgical History:  Procedure Laterality Date  . ANKLE SURGERY     fracture  . COLONOSCOPY  01/15/2012   normal - 10 yr follow up  . melanoma Right 2011   removal-   . MENISCUS  REPAIR Left 01/2016    Social History   Tobacco Use  . Smoking status: Never Smoker  . Smokeless tobacco: Never Used  Substance Use Topics  . Alcohol use: No  . Drug use: No     Medication list has been reviewed and updated.  Current Meds  Medication Sig  . fluticasone (FLONASE) 50 MCG/ACT nasal spray Place 2 sprays into both nostrils daily.  . rizatriptan (MAXALT) 10 MG tablet Take 1 tablet (10 mg total) by mouth as needed for migraine. May repeat in 2 hours if needed    [facility] 2/9 Scores 04/06/2017 10/04/2016  PHQ - 2 Score 0 0    Physical Exam  Constitutional: She is oriented to person, place, and time. She appears well-developed. No distress.  HENT:  Head: Normocephalic and atraumatic.  Cardiovascular: Normal rate, regular rhythm and normal heart sounds.  Pulmonary/Chest: Effort normal and breath sounds normal. No respiratory distress.  Musculoskeletal: Normal range of motion.       Right hip: Normal. She exhibits normal range of motion, normal strength and no tenderness.       Left hip: Normal. She exhibits normal range of motion, normal strength and no tenderness.       Legs: Neurological: She is alert and oriented to person, place, and time.  Skin: Skin is warm and dry. No rash noted.  Psychiatric: She has a normal mood and affect. Her behavior  is normal. Thought content normal.  Nursing note and vitals reviewed.   BP 130/78 (BP Location: Right Arm, Patient Position: Sitting, Cuff Size: Normal)   Pulse 73   Ht 5\' 3"  (1.6 m)   Wt 106 lb (48.1 kg)   SpO2 97%   BMI 18.78 kg/m   Assessment and Plan: 1. Groin strain, left, initial encounter Continue Aleve 2 tabs bid May change to prednisone if sx are worsening - predniSONE (DELTASONE) 10 MG tablet; Take 1 tablet (10 mg total) by mouth as directed for 6 days. Take 6,5,4,3,2,1 then stop  Dispense: 21 tablet; Refill: 0   Partially dictated using Editor, commissioning. Any errors are unintentional.  Halina Maidens,  MD [facility]  01/08/2018

## 2018-01-08 NOTE — Patient Instructions (Addendum)
Aleve 2 tabs twice a day Can also take tylenol 2 tabs up to 4 times a day along with Aleve  If you take Prednisone, do not take Aleve or Advil but you can take tylenol although probably not helpful  Use ice or heat

## 2018-03-22 DIAGNOSIS — Z08 Encounter for follow-up examination after completed treatment for malignant neoplasm: Secondary | ICD-10-CM | POA: Diagnosis not present

## 2018-03-22 DIAGNOSIS — D485 Neoplasm of uncertain behavior of skin: Secondary | ICD-10-CM | POA: Diagnosis not present

## 2018-03-22 DIAGNOSIS — Z85828 Personal history of other malignant neoplasm of skin: Secondary | ICD-10-CM | POA: Diagnosis not present

## 2018-03-22 DIAGNOSIS — Z85820 Personal history of malignant melanoma of skin: Secondary | ICD-10-CM | POA: Diagnosis not present

## 2018-03-22 DIAGNOSIS — L570 Actinic keratosis: Secondary | ICD-10-CM | POA: Diagnosis not present

## 2018-03-22 DIAGNOSIS — C4401 Basal cell carcinoma of skin of lip: Secondary | ICD-10-CM | POA: Diagnosis not present

## 2018-04-17 ENCOUNTER — Encounter: Payer: Self-pay | Admitting: Internal Medicine

## 2018-04-17 ENCOUNTER — Ambulatory Visit (INDEPENDENT_AMBULATORY_CARE_PROVIDER_SITE_OTHER): Payer: 59 | Admitting: Internal Medicine

## 2018-04-17 ENCOUNTER — Other Ambulatory Visit: Payer: Self-pay

## 2018-04-17 ENCOUNTER — Other Ambulatory Visit (HOSPITAL_COMMUNITY)
Admission: RE | Admit: 2018-04-17 | Discharge: 2018-04-17 | Disposition: A | Discharge status: Home / Self Care | Payer: 59 | Source: Ambulatory Visit | Attending: Internal Medicine | Admitting: Internal Medicine

## 2018-04-17 VITALS — BP 103/72 | HR 78 | Resp 16 | Ht 63.0 in | Wt 111.3 lb

## 2018-04-17 DIAGNOSIS — Z0000 Encounter for general adult medical examination without abnormal findings: Secondary | ICD-10-CM

## 2018-04-17 DIAGNOSIS — R7989 Other specified abnormal findings of blood chemistry: Secondary | ICD-10-CM | POA: Diagnosis not present

## 2018-04-17 DIAGNOSIS — Z124 Encounter for screening for malignant neoplasm of cervix: Secondary | ICD-10-CM | POA: Diagnosis not present

## 2018-04-17 DIAGNOSIS — Z1231 Encounter for screening mammogram for malignant neoplasm of breast: Secondary | ICD-10-CM | POA: Diagnosis not present

## 2018-04-17 DIAGNOSIS — M65331 Trigger finger, right middle finger: Secondary | ICD-10-CM | POA: Diagnosis not present

## 2018-04-17 DIAGNOSIS — G43C0 Periodic headache syndromes in child or adult, not intractable: Secondary | ICD-10-CM

## 2018-04-17 MED ORDER — RIZATRIPTAN BENZOATE 10 MG PO TABS: 10.0000 mg | ORAL_TABLET | ORAL | 12 refills | Status: DC | PRN

## 2018-04-17 NOTE — Progress Notes (Signed)
Date:  04/17/2018   Name:  Kristi Murphy   DOB:  01-27-1961   MRN:  [id number]   Chief Complaint: Annual Exam Kristi Murphy is a 58 y.o. female who presents today for her Complete Annual Exam. She feels fairly well. She reports exercising regularly. She reports she is sleeping fairly well.   Mammogram: 10/2017 Colonoscopy: 2011 - normal Pap: 08/2015 Skin exam: 03/2018  Migraine   This is a recurrent problem. The problem occurs monthly. The problem has been unchanged. The pain does not radiate. Pertinent negatives include no abdominal pain, coughing, dizziness, fever, hearing loss, tinnitus or vomiting. She has tried triptans for the symptoms. The treatment provided significant relief.  Hand Pain   There was no injury mechanism. Quality: triggered of middle finger right hand. Pertinent negatives include no chest pain.    Review of Systems  Constitutional: Negative for chills, fatigue and fever.  HENT: Negative for congestion, hearing loss, tinnitus and trouble swallowing.   Eyes: Negative for visual disturbance.  Respiratory: Negative for cough, chest tightness, shortness of breath and wheezing.   Cardiovascular: Negative for chest pain, palpitations and leg swelling.  Gastrointestinal: Negative for abdominal pain, constipation, diarrhea and vomiting.  Endocrine: Negative for polydipsia and polyuria.  Genitourinary: Negative for dysuria, frequency, genital sores, vaginal bleeding and vaginal discharge.  Musculoskeletal: Positive for arthralgias. Negative for gait problem and joint swelling.  Skin: Negative for color change and rash.  Allergic/Immunologic: Positive for environmental allergies.  Neurological: Positive for headaches. Negative for dizziness, tremors and light-headedness.  Hematological: Negative for adenopathy. Does not bruise/bleed easily.  Psychiatric/Behavioral: Negative for dysphoric mood and sleep disturbance. The patient is not nervous/anxious.     Patient Active  Problem List   Diagnosis Date Noted  . Ganglion of right ankle 04/06/2017  . Hyperglycemia 10/04/2016  . Hx of melanoma of skin 10/04/2016  . Periodic headache syndrome, not intractable 10/04/2016  . Environmental and seasonal allergies 10/04/2016  . Primary osteoarthritis of left knee 10/04/2016    Allergies  Allergen Reactions  . Clindamycin/Lincomycin Hives    Past Surgical History:  Procedure Laterality Date  . ANKLE SURGERY     fracture  . COLONOSCOPY  01/15/2012   normal - 10 yr follow up  . melanoma Right 2011   removal-   . MENISCUS REPAIR Left 01/2016    Social History   Tobacco Use  . Smoking status: Never Smoker  . Smokeless tobacco: Never Used  Substance Use Topics  . Alcohol use: No  . Drug use: No     Medication list has been reviewed and updated.  Current Meds  Medication Sig  . fluticasone (FLONASE) 50 MCG/ACT nasal spray Place 2 sprays into both nostrils daily.  . rizatriptan (MAXALT) 10 MG tablet Take 1 tablet (10 mg total) by mouth as needed for migraine. May repeat in 2 hours if needed    [facility] 2/9 Scores 04/17/2018 04/06/2017 10/04/2016  PHQ - 2 Score 0 0 0  PHQ- 9 Score 0 - -    Physical Exam Vitals signs and nursing note reviewed.  Constitutional:      General: She is not in acute distress.    Appearance: She is well-developed.  HENT:     Head: Normocephalic and atraumatic.     Right Ear: Tympanic membrane and ear canal normal.     Left Ear: Tympanic membrane and ear canal normal.     Nose:     Right Sinus: No maxillary sinus tenderness.  Left Sinus: No maxillary sinus tenderness.     Mouth/Throat:     Pharynx: Uvula midline.  Eyes:     General: No scleral icterus.       Right eye: No discharge.        Left eye: No discharge.     Conjunctiva/sclera: Conjunctivae normal.  Neck:     Musculoskeletal: Normal range of motion. No erythema.     Thyroid: No thyromegaly.     Vascular: No carotid bruit.  Cardiovascular:     Rate and  Rhythm: Normal rate and regular rhythm.     Pulses: Normal pulses.     Heart sounds: Normal heart sounds.  Pulmonary:     Effort: Pulmonary effort is normal. No respiratory distress.     Breath sounds: No wheezing.  Chest:     Breasts:        Right: No mass, nipple discharge, skin change or tenderness.        Left: No mass, nipple discharge, skin change or tenderness.  Abdominal:     General: Bowel sounds are normal.     Palpations: Abdomen is soft.     Tenderness: There is no abdominal tenderness.  Genitourinary:    Labia:        Right: No tenderness or lesion.        Left: No tenderness or lesion.      Vagina: Normal.     Cervix: Normal.     Uterus: Normal.      Adnexa: Right adnexa normal and left adnexa normal.  Musculoskeletal: Normal range of motion.     Right lower leg: No edema.     Left lower leg: No edema.     Comments: Mild thickening of palmar fascia at base of middle finger right hand OA changes of both hands noted  Lymphadenopathy:     Cervical: No cervical adenopathy.  Skin:    General: Skin is warm and dry.     Findings: No rash.  Neurological:     Mental Status: She is alert and oriented to person, place, and time.     Cranial Nerves: No cranial nerve deficit.     Sensory: No sensory deficit.     Deep Tendon Reflexes: Reflexes are normal and symmetric.  Psychiatric:        Speech: Speech normal.        Behavior: Behavior normal.        Thought Content: Thought content normal.     Wt Readings from Last 3 Encounters:  04/17/18 111 lb 4.8 oz (50.5 kg)  01/08/18 106 lb (48.1 kg)  12/29/17 106 lb (48.1 kg)    BP 103/72   Pulse 78   Resp 16   Ht 5\' 3"  (1.6 m)   Wt 111 lb 4.8 oz (50.5 kg)   SpO2 98%   BMI 19.72 kg/m   Assessment and Plan: 1. Annual physical exam Normal exam Continue regular exercise and healthy diet Colonoscopy due next year She will consider Shingrix - CBC with Differential/Platelet - Comprehensive metabolic panel - Lipid  panel - TSH  2. Encounter for screening mammogram for breast cancer Done in November - continue annually  3. Encounter for Papanicolaou smear for cervical cancer screening Normal exam - Cytology - PAP  4. Periodic headache syndrome, not intractable Remains responsive to treatment - rizatriptan (MAXALT) 10 MG tablet; Take 1 tablet (10 mg total) by mouth as needed for migraine. May repeat in 2 hours if needed  Dispense:  12 tablet; Refill: 12  5. Trigger middle finger of right hand Supportive care, avoid overuse See Ortho if worsening   Partially dictated using Editor, commissioning. Any errors are unintentional.  Halina Maidens, MD [facility]  04/17/2018

## 2018-04-18 LAB — CBC WITH DIFFERENTIAL/PLATELET
BASOS: 1 %
Basophils Absolute: 0 10*3/uL (ref 0.0–0.2)
EOS (ABSOLUTE): 0.1 10*3/uL (ref 0.0–0.4)
EOS: 2 %
HEMATOCRIT: 38.9 % (ref 34.0–46.6)
HEMOGLOBIN: 12.7 g/dL (ref 11.1–15.9)
IMMATURE GRANS (ABS): 0 10*3/uL (ref 0.0–0.1)
IMMATURE GRANULOCYTES: 0 %
LYMPHS: 46 %
Lymphocytes Absolute: 1.5 10*3/uL (ref 0.7–3.1)
MCH: 29.7 pg (ref 26.6–33.0)
MCHC: 32.6 g/dL (ref 31.5–35.7)
MCV: 91 fL (ref 79–97)
MONOCYTES: 10 %
Monocytes Absolute: 0.3 10*3/uL (ref 0.1–0.9)
NEUTROS ABS: 1.4 10*3/uL (ref 1.4–7.0)
NEUTROS PCT: 41 %
PLATELETS: 260 10*3/uL (ref 150–450)
RBC: 4.28 x10E6/uL (ref 3.77–5.28)
RDW: 12.1 % (ref 11.7–15.4)
WBC: 3.3 10*3/uL — ABNORMAL LOW (ref 3.4–10.8)

## 2018-04-18 LAB — CYTOLOGY - PAP
Diagnosis: NEGATIVE
HPV (WINDOPATH): NOT DETECTED

## 2018-04-18 LAB — LIPID PANEL
CHOL/HDL RATIO: 2.8 ratio (ref 0.0–4.4)
Cholesterol, Total: 146 mg/dL (ref 100–199)
HDL: 53 mg/dL (ref >39)
LDL Calculated: 82 mg/dL (ref 0–99)
TRIGLYCERIDES: 56 mg/dL (ref 0–149)
VLDL CHOLESTEROL CAL: 11 mg/dL (ref 5–40)

## 2018-04-18 LAB — COMPREHENSIVE METABOLIC PANEL
ALBUMIN: 4 g/dL (ref 3.8–4.9)
ALT: 22 IU/L (ref 0–32)
AST: 28 IU/L (ref 0–40)
Albumin/Globulin Ratio: 1.5 (ref 1.2–2.2)
Alkaline Phosphatase: 105 IU/L (ref 39–117)
BUN / CREAT RATIO: 22 (ref 9–23)
BUN: 17 mg/dL (ref 6–24)
Bilirubin Total: 0.4 mg/dL (ref 0.0–1.2)
CALCIUM: 9.4 mg/dL (ref 8.7–10.2)
CO2: 22 mmol/L (ref 20–29)
Chloride: 104 mmol/L (ref 96–106)
Creatinine, Ser: 0.76 mg/dL (ref 0.57–1.00)
GFR, EST AFRICAN AMERICAN: 99 mL/min/{1.73_m2} (ref >59)
GFR, EST NON AFRICAN AMERICAN: 86 mL/min/{1.73_m2} (ref >59)
GLOBULIN, TOTAL: 2.6 g/dL (ref 1.5–4.5)
Glucose: 85 mg/dL (ref 65–99)
Potassium: 4.4 mmol/L (ref 3.5–5.2)
Sodium: 142 mmol/L (ref 134–144)
TOTAL PROTEIN: 6.6 g/dL (ref 6.0–8.5)

## 2018-04-18 LAB — TSH: TSH: 5.91 u[IU]/mL — ABNORMAL HIGH (ref 0.450–4.500)

## 2018-04-24 LAB — SPECIMEN STATUS REPORT

## 2018-04-24 LAB — T4: T4, Total: 8.3 ug/dL (ref 4.5–12.0)

## 2018-05-29 DIAGNOSIS — L814 Other melanin hyperpigmentation: Secondary | ICD-10-CM | POA: Diagnosis not present

## 2018-05-29 DIAGNOSIS — L578 Other skin changes due to chronic exposure to nonionizing radiation: Secondary | ICD-10-CM | POA: Diagnosis not present

## 2018-05-29 DIAGNOSIS — C4401 Basal cell carcinoma of skin of lip: Secondary | ICD-10-CM | POA: Diagnosis not present

## 2018-09-18 ENCOUNTER — Other Ambulatory Visit: Payer: Self-pay | Admitting: Internal Medicine

## 2018-09-18 DIAGNOSIS — G43C0 Periodic headache syndromes in child or adult, not intractable: Secondary | ICD-10-CM

## 2018-10-01 ENCOUNTER — Other Ambulatory Visit: Payer: Self-pay | Admitting: Internal Medicine

## 2018-10-01 DIAGNOSIS — Z1231 Encounter for screening mammogram for malignant neoplasm of breast: Secondary | ICD-10-CM

## 2018-11-06 ENCOUNTER — Ambulatory Visit: Payer: 59

## 2018-11-22 ENCOUNTER — Other Ambulatory Visit: Payer: Self-pay

## 2018-11-22 ENCOUNTER — Ambulatory Visit
Admission: RE | Admit: 2018-11-22 | Discharge: 2018-11-22 | Disposition: A | Discharge status: Home / Self Care | Payer: 59 | Source: Ambulatory Visit | Attending: Internal Medicine | Admitting: Internal Medicine

## 2018-11-22 DIAGNOSIS — Z1231 Encounter for screening mammogram for malignant neoplasm of breast: Secondary | ICD-10-CM | POA: Diagnosis present

## 2019-03-01 ENCOUNTER — Other Ambulatory Visit: Payer: Self-pay

## 2019-03-01 DIAGNOSIS — M25571 Pain in right ankle and joints of right foot: Secondary | ICD-10-CM

## 2019-03-01 DIAGNOSIS — M67471 Ganglion, right ankle and foot: Secondary | ICD-10-CM

## 2019-03-01 NOTE — Progress Notes (Signed)
Pt called requesting a referral for her right ankle. It is flaring up again. Having pain when walking on it, and its very painful at the ankle joint.   Pleased referral to preferred location per patient.

## 2019-03-02 ENCOUNTER — Ambulatory Visit
Admission: EM | Admit: 2019-03-02 | Discharge: 2019-03-02 | Disposition: A | Discharge status: Home / Self Care | Payer: 59 | Attending: Family Medicine | Admitting: Family Medicine

## 2019-03-02 ENCOUNTER — Other Ambulatory Visit: Payer: Self-pay

## 2019-03-02 ENCOUNTER — Encounter: Payer: Self-pay | Admitting: Emergency Medicine

## 2019-03-02 ENCOUNTER — Ambulatory Visit (INDEPENDENT_AMBULATORY_CARE_PROVIDER_SITE_OTHER): Payer: 59

## 2019-03-02 DIAGNOSIS — M25571 Pain in right ankle and joints of right foot: Secondary | ICD-10-CM | POA: Diagnosis not present

## 2019-03-02 MED ORDER — PREDNISONE 10 MG PO TABS: ORAL_TABLET | ORAL | 0 refills | Status: DC

## 2019-03-02 NOTE — ED Triage Notes (Signed)
Patient c/o pain in her right ankle for a week.  Patient denies injury or fall.

## 2019-03-02 NOTE — Discharge Instructions (Signed)
Take medication as prescribed. Rest. Drink plenty of fluids. Ice. Brace as needed.   Follow-up with orthopedic as needed for continued pain.  Follow up with your primary care physician this week as needed. Return to Urgent care for new or worsening concerns.

## 2019-03-02 NOTE — ED Provider Notes (Signed)
[facility] ____________________________________________  Time seen: Approximately 9:23 AM  I have reviewed the triage vital signs and the nursing notes.   HISTORY  Chief Complaint Ankle Pain (right)  HPI Kristi Murphy is a 59 y.o. female presenting for evaluation of 1 week of medial ankle pain.  Reports this has been gradual in onset.  States pain is directly at the medial malleolus.  Denies fall, direct injury or direct trauma.  Reports she has had history of pain to this ankle in the past with previous surgery as a teenager.  Reports a few years ago she had pain as well similarly.  Does have an ASO brace at home but has not used.  Has been taken ibuprofen 2-3 times a day the last several days with minimal improvement.  Denies pain radiation or paresthesias.  Denies break in skin or injury.  States pain especially to plantarflex and dorsiflex.  Denies Achilles tenderness.  Denies other leg pain.  Reports otherwise doing well.  Denies renal insufficiency.  No history of diabetes.  Glean Hess, MD : PCP   Past Medical History:  Diagnosis Date  . Allergy    seasonal  . Migraines   . Skin cancer     Patient Active Problem List   Diagnosis Date Noted  . Trigger middle finger of right hand 04/17/2018  . Ganglion of right ankle 04/06/2017  . Hyperglycemia 10/04/2016  . Hx of melanoma of skin 10/04/2016  . Periodic headache syndrome, not intractable 10/04/2016  . Environmental and seasonal allergies 10/04/2016  . Primary osteoarthritis of left knee 10/04/2016    Past Surgical History:  Procedure Laterality Date  . ANKLE SURGERY     fracture  . COLONOSCOPY  01/15/2012   normal - 10 yr follow up  . melanoma Right 2011   removal-   . MENISCUS REPAIR Left 01/2016     No current facility-administered medications for this encounter.  Current Outpatient Medications:  .  fluticasone (FLONASE) 50 MCG/ACT nasal spray, Place 2 sprays into both nostrils daily.,  Disp: 16 g, Rfl: 12 .  rizatriptan (MAXALT) 10 MG tablet, TAKE 1 TABLET (10 MG TOTAL) BY MOUTH AS NEEDED FOR MIGRAINE. MAY REPEAT IN 2 HOURS IF NEEDED, Disp: 12 tablet, Rfl: 12 .  predniSONE (DELTASONE) 10 MG tablet, Start 60 mg po day one, then 50 mg po day two, taper by 10 mg daily until complete., Disp: 21 tablet, Rfl: 0  Allergies Clindamycin/lincomycin  Family History  Problem Relation Age of Onset  . Skin cancer Mother   . Diabetes Mother   . Leukemia Father   . Skin cancer Father   . Heart disease Father   . Diabetes Sister   . Breast cancer Neg Hx     Social History Social History   Tobacco Use  . Smoking status: Never Smoker  . Smokeless tobacco: Never Used  Substance Use Topics  . Alcohol use: No  . Drug use: No    Review of Systems Constitutional: No fever ENT: No sore throat. Cardiovascular: Denies chest pain. Respiratory: Denies shortness of breath. Musculoskeletal: Positive right ankle pain. Skin: Negative for rash.   ____________________________________________   PHYSICAL EXAM:  VITAL SIGNS: ED Triage Vitals  Enc Vitals Group     BP 03/02/19 0833 (!) 112/56     Pulse Rate 03/02/19 0833 70     Resp 03/02/19 0833 14     Temp 03/02/19 0833 98.3 F (36.8 C)     Temp Source 03/02/19  [id number] Oral     SpO2 03/02/19 0833 100 %     Weight 03/02/19 0831 109 lb (49.4 kg)     Height 03/02/19 0831 5\' 3"  (1.6 m)     Head Circumference --      Peak Flow --      Pain Score 03/02/19 0830 5     Pain Loc --      Pain Edu? --      Excl. in [place]? --     Constitutional: Alert and oriented. Well appearing and in no acute distress. Eyes: Conjunctivae are normal.  ENT      Head: Normocephalic and atraumatic. Cardiovascular: Good peripheral circulation. Respiratory: Normal respiratory effort without tachypnea nor retractions. Breath sounds are clear and equal bilaterally. No wheezes, rales, rhonchi. Musculoskeletal: Ambulatory with antalgic gait.  Bilateral pedal  pulses equal and easily palpated.   Except: Right medial malleolus and anterior along ATFL tenderness direct palpation as well as mild tenderness medial anterior ankle, no swelling, no ecchymosis, no Achilles tenderness, pain with plantarflexion or dorsiflexion, right lower extremity otherwise nontender. Neurologic:  Normal speech and language. Speech is normal. No gait instability.  Skin:  Skin is warm, dry and intact. No rash noted. Psychiatric: Mood and affect are normal. Speech and behavior are normal. Patient exhibits appropriate insight and judgment   ___________________________________________   LABS (all labs ordered are listed, but only abnormal results are displayed)  Labs Reviewed - No data to display ____________________________________________  RADIOLOGY  DG Ankle Complete Right  Result Date: 03/02/2019 CLINICAL DATA:  Right ankle pain 1 week.  No injury. EXAM: RIGHT ANKLE - COMPLETE 3+ VIEW COMPARISON:  None. FINDINGS: Mild degenerative changes over the tibiotalar joint worse medially. No evidence of acute fracture or dislocation. IMPRESSION: No acute findings. Electronically Signed   By: Marin Olp M.D.   On: 03/02/2019 09:31   ____________________________________________   PROCEDURES Procedures     INITIAL IMPRESSION / ASSESSMENT AND PLAN / ED COURSE  Pertinent labs & imaging results that were available during my care of the patient were reviewed by me and considered in my medical decision making (see chart for details).  Well-appearing patient.  No acute distress.  Right ankle pain.  Right ankle x-ray as above, no acute findings, mild degenerative changes.  Suspect inflammatory.  Will treat with prednisone.  Use brace at home, ice and elevate.  Follow-up with orthopedic for continued pain.Discussed indication, risks and benefits of medications with patient.  Discussed follow up and return parameters including no resolution or any worsening concerns. Patient  verbalized understanding and agreed to plan.   ____________________________________________   FINAL CLINICAL IMPRESSION(S) / ED DIAGNOSES  Final diagnoses:  Acute right ankle pain     ED Discharge Orders         Ordered    predniSONE (DELTASONE) 10 MG tablet     03/02/19 0940           Note: This dictation was prepared with Dragon dictation along with smaller phrase technology. Any transcriptional errors that result from this process are unintentional.         Marylene Land, NP 03/02/19 1014

## 2019-04-18 ENCOUNTER — Encounter: Payer: 59 | Admitting: Internal Medicine

## 2019-04-30 ENCOUNTER — Encounter: Payer: Self-pay | Admitting: Internal Medicine

## 2019-04-30 ENCOUNTER — Other Ambulatory Visit: Payer: Self-pay

## 2019-04-30 ENCOUNTER — Ambulatory Visit (INDEPENDENT_AMBULATORY_CARE_PROVIDER_SITE_OTHER): Payer: 59 | Admitting: Internal Medicine

## 2019-04-30 VITALS — BP 120/78 | HR 68 | Temp 97.5°F | Ht 63.0 in | Wt 110.0 lb

## 2019-04-30 DIAGNOSIS — Z1211 Encounter for screening for malignant neoplasm of colon: Secondary | ICD-10-CM | POA: Diagnosis not present

## 2019-04-30 DIAGNOSIS — Z1231 Encounter for screening mammogram for malignant neoplasm of breast: Secondary | ICD-10-CM

## 2019-04-30 DIAGNOSIS — G43009 Migraine without aura, not intractable, without status migrainosus: Secondary | ICD-10-CM

## 2019-04-30 DIAGNOSIS — M1712 Unilateral primary osteoarthritis, left knee: Secondary | ICD-10-CM

## 2019-04-30 DIAGNOSIS — Z85820 Personal history of malignant melanoma of skin: Secondary | ICD-10-CM | POA: Diagnosis not present

## 2019-04-30 DIAGNOSIS — J3089 Other allergic rhinitis: Secondary | ICD-10-CM

## 2019-04-30 DIAGNOSIS — Z0000 Encounter for general adult medical examination without abnormal findings: Secondary | ICD-10-CM | POA: Diagnosis not present

## 2019-04-30 LAB — POCT URINALYSIS DIPSTICK
Bilirubin, UA: NEGATIVE
Glucose, UA: NEGATIVE
Ketones, UA: NEGATIVE
Leukocytes, UA: NEGATIVE
Nitrite, UA: NEGATIVE
Protein, UA: NEGATIVE
Spec Grav, UA: <=1.005 — AB (ref 1.010–1.025)
Urobilinogen, UA: 0.2 E.U./dL
pH, UA: 7 (ref 5.0–8.0)

## 2019-04-30 MED ORDER — FLUTICASONE PROPIONATE 50 MCG/ACT NA SUSP: 2.0000 | Freq: Every day | NASAL | 12 refills | Status: DC

## 2019-04-30 NOTE — Patient Instructions (Signed)
Call for a Nurse visit to get Shingrix vaccine about 4-6 weeks after your covid vaccine(s) is completed.

## 2019-04-30 NOTE — Progress Notes (Signed)
Date:  04/30/2019   Name:  Kristi Murphy   DOB:  12-02-1960   MRN:  [id number]   Chief Complaint: Annual Exam (breast exam- no pap ) Kristi Murphy is a 59 y.o. female who presents today for her Complete Annual Exam. She feels fairly well. She reports exercising reglularly. She reports she is sleeping fairly well. She denies breast issues.  Mammogram  11/2018 Pap  04/2018 negative with cotesting Colonoscopy 2011 in [place] History  Administered Date(s) Administered  . Influenza Inj Mdck Quad Pf 11/09/2018    Migraine  This is a recurrent problem. The problem occurs monthly. The problem has been unchanged. The pain quality is similar to prior headaches. Pertinent negatives include no abdominal pain, coughing, dizziness, fever, hearing loss, tinnitus or vomiting. She has tried triptans for the symptoms. The treatment provided significant relief.  Knee Pain  The pain is present in the left knee and right knee. The quality of the pain is described as aching. The pain is moderate. The pain has been fluctuating since onset. She has tried NSAIDs for the symptoms. The treatment provided moderate relief.    Lab Results  Component Value Date   CREATININE 0.76 04/17/2018   BUN 17 04/17/2018   NA 142 04/17/2018   K 4.4 04/17/2018   CL 104 04/17/2018   CO2 22 04/17/2018   Lab Results  Component Value Date   CHOL 146 04/17/2018   HDL 53 04/17/2018   LDLCALC 82 04/17/2018   TRIG 56 04/17/2018   CHOLHDL 2.8 04/17/2018   Lab Results  Component Value Date   TSH 5.910 (H) 04/17/2018   Lab Results  Component Value Date   HGBA1C 5.5 10/04/2016   Lab Results  Component Value Date   WBC 3.3 (L) 04/17/2018   HGB 12.7 04/17/2018   HCT 38.9 04/17/2018   MCV 91 04/17/2018   PLT 260 04/17/2018   Lab Results  Component Value Date   ALT 22 04/17/2018   AST 28 04/17/2018   ALKPHOS 105 04/17/2018   BILITOT 0.4 04/17/2018     Review of Systems  Constitutional: Negative for  chills, fatigue and fever.  HENT: Negative for congestion, hearing loss, tinnitus, trouble swallowing and voice change.   Eyes: Negative for visual disturbance.  Respiratory: Negative for cough, chest tightness, shortness of breath and wheezing.   Cardiovascular: Negative for chest pain, palpitations and leg swelling.  Gastrointestinal: Negative for abdominal pain, constipation, diarrhea and vomiting.  Endocrine: Negative for polydipsia and polyuria.  Genitourinary: Negative for dysuria, frequency, hematuria and urgency.  Musculoskeletal: Positive for arthralgias (knee pain with overuse). Negative for gait problem and joint swelling.  Skin: Negative for color change and rash.  Neurological: Negative for dizziness, tremors, light-headedness and headaches (migraines unchanged).  Hematological: Negative for adenopathy. Does not bruise/bleed easily.  Psychiatric/Behavioral: Negative for dysphoric mood and sleep disturbance (intermittent disruptions). The patient is not nervous/anxious.     Patient Active Problem List   Diagnosis Date Noted  . Trigger middle finger of right hand 04/17/2018  . Ganglion of right ankle 04/06/2017  . Hyperglycemia 10/04/2016  . Hx of melanoma of skin 10/04/2016  . Migraine without aura and responsive to treatment 10/04/2016  . Environmental and seasonal allergies 10/04/2016  . Primary osteoarthritis of left knee 10/04/2016    Allergies  Allergen Reactions  . Clindamycin/Lincomycin Hives    Past Surgical History:  Procedure Laterality Date  . ANKLE SURGERY     fracture  . COLONOSCOPY  01/15/2012   normal - 10 yr follow up  . melanoma Right 2011   removal-   . MENISCUS REPAIR Left 01/2016    Social History   Tobacco Use  . Smoking status: Never Smoker  . Smokeless tobacco: Never Used  Substance Use Topics  . Alcohol use: No  . Drug use: No     Medication list has been reviewed and updated.  Current Meds  Medication Sig  . fluticasone  (FLONASE) 50 MCG/ACT nasal spray Place 2 sprays into both nostrils daily.  . rizatriptan (MAXALT) 10 MG tablet TAKE 1 TABLET (10 MG TOTAL) BY MOUTH AS NEEDED FOR MIGRAINE. MAY REPEAT IN 2 HOURS IF NEEDED    PHQ 2/9 Scores 04/30/2019 04/17/2018 04/06/2017 10/04/2016  PHQ - 2 Score 0 0 0 0  PHQ- 9 Score 0 0 - -    BP Readings from Last 3 Encounters:  04/30/19 120/78  03/02/19 (!) 112/56  04/17/18 103/72    Physical Exam Vitals and nursing note reviewed.  Constitutional:      General: She is not in acute distress.    Appearance: She is well-developed.  HENT:     Head: Normocephalic and atraumatic.     Right Ear: Tympanic membrane and ear canal normal.     Left Ear: Tympanic membrane and ear canal normal.     Nose:     Right Sinus: No maxillary sinus tenderness.     Left Sinus: No maxillary sinus tenderness.  Eyes:     General: No scleral icterus.       Right eye: No discharge.        Left eye: No discharge.     Conjunctiva/sclera: Conjunctivae normal.  Neck:     Thyroid: No thyromegaly.     Vascular: No carotid bruit.  Cardiovascular:     Rate and Rhythm: Normal rate and regular rhythm.     Pulses: Normal pulses.     Heart sounds: Normal heart sounds.  Pulmonary:     Effort: Pulmonary effort is normal. No respiratory distress.     Breath sounds: No wheezing.  Chest:     Breasts:        Right: No mass, nipple discharge, skin change or tenderness.        Left: No mass, nipple discharge, skin change or tenderness.  Abdominal:     General: Bowel sounds are normal.     Palpations: Abdomen is soft.     Tenderness: There is no abdominal tenderness.  Musculoskeletal:        General: Normal range of motion.     Cervical back: Normal range of motion. No erythema.  Lymphadenopathy:     Cervical: No cervical adenopathy.  Skin:    General: Skin is warm and dry.     Findings: No rash.  Neurological:     Mental Status: She is alert and oriented to person, place, and time.      Cranial Nerves: No cranial nerve deficit.     Sensory: No sensory deficit.     Deep Tendon Reflexes: Reflexes are normal and symmetric.  Psychiatric:        Speech: Speech normal.        Behavior: Behavior normal.        Thought Content: Thought content normal.     Wt Readings from Last 3 Encounters:  04/30/19 110 lb (49.9 kg)  03/02/19 109 lb (49.4 kg)  04/17/18 111 lb 4.8 oz (50.5 kg)    BP  120/78   Pulse 68   Temp (!) 97.5 F (36.4 C) (Temporal)   Ht 5\' 3"  (1.6 m)   Wt 110 lb (49.9 kg)   SpO2 94%   BMI 19.49 kg/m   Assessment and Plan: 1. Annual physical exam Normal exam Continue healthy diet, regular exercise - CBC with Differential/Platelet - Comprehensive metabolic panel - Lipid panel - TSH - POCT urinalysis dipstick  2. Encounter for screening mammogram for breast cancer Schedule in October at [facility]; Future  3. Colon cancer screening Pt believes it was done in 2012 She will try to get her report from [place]  4. Environmental and seasonal allergies - fluticasone (FLONASE) 50 MCG/ACT nasal spray; Place 2 sprays into both nostrils daily.  Dispense: 16 g; Refill: 12  5. Migraine without aura and responsive to treatment Intermittent headaches still respond well to triptans Continue Maxalt PRN  6. Primary osteoarthritis of left knee Mild discomfort relieved by Aleve, 1 tab daily S/p arthroscopic surgery  7. Hx of melanoma of skin Being followed by dermatology Seeing someone tomorrow in [facility]  Pt will return for Shingrix vaccine after she completes her Covid vaccines. Partially dictated using Editor, commissioning. Any errors are unintentional.  Halina Maidens, MD [facility]  04/30/2019

## 2019-05-01 LAB — CBC WITH DIFFERENTIAL/PLATELET
Basophils Absolute: 0.1 10*3/uL (ref 0.0–0.2)
Basos: 2 %
EOS (ABSOLUTE): 0.3 10*3/uL (ref 0.0–0.4)
Eos: 7 %
Hematocrit: 39.9 % (ref 34.0–46.6)
Hemoglobin: 13.3 g/dL (ref 11.1–15.9)
Immature Grans (Abs): 0 10*3/uL (ref 0.0–0.1)
Immature Granulocytes: 0 %
Lymphocytes Absolute: 1.3 10*3/uL (ref 0.7–3.1)
Lymphs: 34 %
MCH: 31 pg (ref 26.6–33.0)
MCHC: 33.3 g/dL (ref 31.5–35.7)
MCV: 93 fL (ref 79–97)
Monocytes Absolute: 0.5 10*3/uL (ref 0.1–0.9)
Monocytes: 14 %
Neutrophils Absolute: 1.6 10*3/uL (ref 1.4–7.0)
Neutrophils: 43 %
Platelets: 308 10*3/uL (ref 150–450)
RBC: 4.29 x10E6/uL (ref 3.77–5.28)
RDW: 11.8 % (ref 11.7–15.4)
WBC: 3.7 10*3/uL (ref 3.4–10.8)

## 2019-05-01 LAB — COMPREHENSIVE METABOLIC PANEL
ALT: 20 IU/L (ref 0–32)
AST: 25 IU/L (ref 0–40)
Albumin/Globulin Ratio: 1.8 (ref 1.2–2.2)
Albumin: 4.2 g/dL (ref 3.8–4.9)
Alkaline Phosphatase: 116 IU/L (ref 39–117)
BUN/Creatinine Ratio: 19 (ref 12–28)
BUN: 15 mg/dL (ref 8–27)
Bilirubin Total: 0.4 mg/dL (ref 0.0–1.2)
CO2: 25 mmol/L (ref 20–29)
Calcium: 9.8 mg/dL (ref 8.7–10.3)
Chloride: 104 mmol/L (ref 96–106)
Creatinine, Ser: 0.8 mg/dL (ref 0.57–1.00)
GFR calc Af Amer: 93 mL/min/{1.73_m2} (ref >59)
GFR calc non Af Amer: 80 mL/min/{1.73_m2} (ref >59)
Globulin, Total: 2.4 g/dL (ref 1.5–4.5)
Glucose: 87 mg/dL (ref 65–99)
Potassium: 4.4 mmol/L (ref 3.5–5.2)
Sodium: 140 mmol/L (ref 134–144)
Total Protein: 6.6 g/dL (ref 6.0–8.5)

## 2019-05-01 LAB — LIPID PANEL
Chol/HDL Ratio: 2.9 ratio (ref 0.0–4.4)
Cholesterol, Total: 171 mg/dL (ref 100–199)
HDL: 59 mg/dL (ref >39)
LDL Chol Calc (NIH): 103 mg/dL — ABNORMAL HIGH (ref 0–99)
Triglycerides: 46 mg/dL (ref 0–149)
VLDL Cholesterol Cal: 9 mg/dL (ref 5–40)

## 2019-05-01 LAB — TSH: TSH: 4.92 u[IU]/mL — ABNORMAL HIGH (ref 0.450–4.500)

## 2019-09-26 ENCOUNTER — Other Ambulatory Visit: Payer: Self-pay | Admitting: Internal Medicine

## 2019-09-26 DIAGNOSIS — G43C0 Periodic headache syndromes in child or adult, not intractable: Secondary | ICD-10-CM

## 2019-09-26 NOTE — Telephone Encounter (Signed)
Requested Prescriptions  Pending Prescriptions Disp Refills  . rizatriptan (MAXALT) 10 MG tablet [Pharmacy Med Name: RIZATRIPTAN 10 MG TABLET] 12 tablet 12    Sig: TAKE 1 TABLET (10 MG TOTAL) BY MOUTH AS NEEDED FOR MIGRAINE. MAY REPEAT IN 2 HOURS IF NEEDED     Neurology:  Migraine Therapy - Triptan Passed - 09/26/2019  1:14 AM      Passed - Last BP in normal range    BP Readings from Last 1 Encounters:  04/30/19 120/78         Passed - Valid encounter within last 12 months    Recent Outpatient Visits          4 months ago Annual physical exam   [facility] Glean Hess, MD   1 year ago Annual physical exam   [facility] Glean Hess, MD   1 year ago Groin strain, left, initial encounter   [facility] Glean Hess, MD   1 year ago Cellulitis of right lower extremity   [facility] Glean Hess, MD   2 years ago Annual physical exam   [facility] Glean Hess, MD      Future Appointments            In 7 months [facility] Melia Jesse Sans, MD [facility], [facility]

## 2019-10-09 ENCOUNTER — Encounter: Payer: Self-pay | Admitting: Internal Medicine

## 2019-10-18 ENCOUNTER — Telehealth: Payer: Self-pay | Admitting: Internal Medicine

## 2019-10-18 NOTE — Telephone Encounter (Signed)
Copied from [place] [phone]. Topic: Appointment Scheduling - Scheduling Inquiry for Clinic >> Oct 18, 2019  1:32 PM Kristi Murphy wrote: Patient would like to schedule a shingle shot, requesting orders

## 2019-10-22 ENCOUNTER — Other Ambulatory Visit: Payer: Self-pay

## 2019-10-22 ENCOUNTER — Ambulatory Visit (INDEPENDENT_AMBULATORY_CARE_PROVIDER_SITE_OTHER): Payer: 59

## 2019-10-22 DIAGNOSIS — Z23 Encounter for immunization: Secondary | ICD-10-CM | POA: Diagnosis not present

## 2019-10-22 NOTE — Progress Notes (Signed)
shingrix

## 2019-11-26 ENCOUNTER — Ambulatory Visit
Admission: RE | Admit: 2019-11-26 | Discharge: 2019-11-26 | Disposition: A | Discharge status: Home / Self Care | Payer: 59 | Source: Ambulatory Visit | Attending: Internal Medicine | Admitting: Internal Medicine

## 2019-11-26 ENCOUNTER — Other Ambulatory Visit: Payer: Self-pay

## 2019-11-26 DIAGNOSIS — Z1231 Encounter for screening mammogram for malignant neoplasm of breast: Secondary | ICD-10-CM | POA: Insufficient documentation

## 2019-12-13 ENCOUNTER — Other Ambulatory Visit: Payer: Self-pay

## 2019-12-13 ENCOUNTER — Ambulatory Visit (INDEPENDENT_AMBULATORY_CARE_PROVIDER_SITE_OTHER): Payer: 59

## 2019-12-13 ENCOUNTER — Ambulatory Visit
Admission: EM | Admit: 2019-12-13 | Discharge: 2019-12-13 | Disposition: A | Discharge status: Home / Self Care | Payer: 59

## 2019-12-13 DIAGNOSIS — M79641 Pain in right hand: Secondary | ICD-10-CM

## 2019-12-13 DIAGNOSIS — M67431 Ganglion, right wrist: Secondary | ICD-10-CM

## 2019-12-13 DIAGNOSIS — M19041 Primary osteoarthritis, right hand: Secondary | ICD-10-CM | POA: Diagnosis not present

## 2019-12-13 DIAGNOSIS — M25531 Pain in right wrist: Secondary | ICD-10-CM | POA: Diagnosis not present

## 2019-12-13 DIAGNOSIS — S6991XA Unspecified injury of right wrist, hand and finger(s), initial encounter: Secondary | ICD-10-CM | POA: Diagnosis not present

## 2019-12-13 NOTE — ED Triage Notes (Signed)
Patient c/o pain in her right wrist and hand that started late Monday evening.  Patient denies injury or fall.  Patient has limited range of motion in her right wrist.

## 2019-12-13 NOTE — Discharge Instructions (Addendum)
X-rays do not show any fractures.  You do have some degenerative changes in the area that you have pain.  On exam, I did note that you have a ganglion cyst near this region as well.  Your wrist pain is likely due to flareup of underlying osteoarthritis of the right hand, but there could be some involvement of the ganglion cyst putting pressure on the nerve.  I would advise using the hand/wrist brace.  Take anti-inflammatory medication (aleve or advil) and Tylenol for pain.  Ice the wrist to help with pain relief and try to keep it elevated.  If you have continued problems with this hand or wrist, please follow-up with orthopedics.  You have a condition requiring you to follow up with Orthopedics so please call one of the following office for appointment:   [facility] [address] [place], [place] [address] Phone: [phone]  [facility] [address] [place], [place] [address] Phone: [phone]

## 2019-12-13 NOTE — ED Provider Notes (Signed)
[facility]    CSN: [id number] Arrival date & time: 12/13/19  0254      History   Chief Complaint Chief Complaint  Patient presents with  . Wrist Pain    right    HPI Kristi Murphy is a 59 y.o. female presenting for pain of the right hand and wrist x4 days.   Patient says the pain is severe and around the thumb and radial aspect of the wrist.  She has pain in flexing the wrist.  Pain radiates into the thumb.  She denies any numbness or tingling.  Patient denies any injury and has never had any problems with this hand or wrist before.  Patient is right-handed.  She denies any excessive overuse of the hand or wrist in the past several days.  She has taken Advil without improvement in pain.  She does not have any other complaints or concerns today.  HPI  Past Medical History:  Diagnosis Date  . Allergy    seasonal  . Migraines   . Skin cancer     Patient Active Problem List   Diagnosis Date Noted  . Trigger middle finger of right hand 04/17/2018  . Ganglion of right ankle 04/06/2017  . Hyperglycemia 10/04/2016  . Hx of melanoma of skin 10/04/2016  . Migraine without aura and responsive to treatment 10/04/2016  . Environmental and seasonal allergies 10/04/2016  . Primary osteoarthritis of left knee 10/04/2016    Past Surgical History:  Procedure Laterality Date  . ANKLE SURGERY     fracture  . COLONOSCOPY  01/15/2012   normal - 10 yr follow up  . melanoma Right 2011   removal-   . MENISCUS REPAIR Left 01/2016    OB History   No obstetric history on file.      Home Medications    Prior to Admission medications   Medication Sig Start Date End Date Taking? Authorizing Provider  Cholecalciferol (VITAMIN D3) 10 MCG (400 UNIT) CAPS Take 1 capsule by mouth daily. 03/04/19  Yes [provider]  fluticasone (FLONASE) 50 MCG/ACT nasal spray Place 2 sprays into both nostrils daily. 04/30/19  Yes Glean Hess, MD  Multiple Vitamin (MULTIVITAMIN)  tablet Take 1 tablet by mouth daily.   Yes [provider]  rizatriptan (MAXALT) 10 MG tablet TAKE 1 TABLET (10 MG TOTAL) BY MOUTH AS NEEDED FOR MIGRAINE. MAY REPEAT IN 2 HOURS IF NEEDED 09/26/19  Yes Glean Hess, MD    Family History Family History  Problem Relation Age of Onset  . Skin cancer Mother   . Diabetes Mother   . Leukemia Father   . Skin cancer Father   . Heart disease Father   . Diabetes Sister   . Breast cancer Neg Hx     Social History Social History   Tobacco Use  . Smoking status: Never Smoker  . Smokeless tobacco: Never Used  Vaping Use  . Vaping Use: Never used  Substance Use Topics  . Alcohol use: No  . Drug use: No     Allergies   Clindamycin/lincomycin   Review of Systems Review of Systems  Constitutional: Negative for fatigue and fever.  Musculoskeletal: Positive for arthralgias and joint swelling.  Skin: Negative for color change, rash and wound.  Neurological: Negative for weakness and numbness.     Physical Exam Triage Vital Signs ED Triage Vitals  Enc Vitals Group     BP 12/13/19 0913 111/61     Pulse Rate 12/13/19  0913 61     Resp 12/13/19 0913 14     Temp 12/13/19 0913 98.4 F (36.9 C)     Temp Source 12/13/19 0913 Oral     SpO2 12/13/19 0913 100 %     Weight 12/13/19 0911 106 lb (48.1 kg)     Height 12/13/19 0911 5\' 3"  (1.6 m)     Head Circumference --      Peak Flow --      Pain Score 12/13/19 0910 9     Pain Loc --      Pain Edu? --      Excl. in [place]? --    No data found.  Updated Vital Signs BP 111/61 (BP Location: Left Arm)   Pulse 61   Temp 98.4 F (36.9 C) (Oral)   Resp 14   Ht 5\' 3"  (1.6 m)   Wt 106 lb (48.1 kg)   SpO2 100%   BMI 18.78 kg/m      Physical Exam Vitals and nursing note reviewed.  Constitutional:      General: She is not in acute distress.    Appearance: Normal appearance. She is not ill-appearing or toxic-appearing.  HENT:     Head: Normocephalic and atraumatic.  Eyes:       General: No scleral icterus.       Right eye: No discharge.        Left eye: No discharge.     Conjunctiva/sclera: Conjunctivae normal.  Cardiovascular:     Rate and Rhythm: Normal rate and regular rhythm.     Pulses: Normal pulses.  Pulmonary:     Effort: Pulmonary effort is normal. No respiratory distress.  Musculoskeletal:     Right wrist: Swelling (dorsal wrist and hand mildly) and tenderness (ventral aspect of radial side of wrist is most tender. Also has correlating tenderness of the dorsal radial wrist. +ganglion cyst right ventral wrist which is not tender) present. Decreased range of motion.     Cervical back: Neck supple.  Skin:    General: Skin is dry.  Neurological:     General: No focal deficit present.     Mental Status: She is alert. Mental status is at baseline.     Motor: No weakness.     Gait: Gait normal.  Psychiatric:        Mood and Affect: Mood normal.        Behavior: Behavior normal.        Thought Content: Thought content normal.      UC Treatments / Results  Labs (all labs ordered are listed, but only abnormal results are displayed) Labs Reviewed - No data to display  EKG   Radiology DG Wrist Complete Right  Result Date: 12/13/2019 CLINICAL DATA:  Pain EXAM: RIGHT WRIST - COMPLETE 3+ VIEW COMPARISON:  None. FINDINGS: Frontal, oblique, lateral, and ulnar deviation scaphoid images were obtained. No fracture or dislocation. No appreciable joint space narrowing or erosion. There is calcification in the scapholunate joint as well as in the triangular fibrocartilage region. There is a benign cystic area in the distal scaphoid measuring 4 x 4 mm. IMPRESSION: Calcification in the triangular fibrocartilage and scapholunate joint regions, likely arthroplasty in etiology but potentially indicative of prior trauma. No acute fracture or dislocation. No appreciable joint space narrowing or erosion. Electronically Signed   By: Lowella Grip III M.D.   On:  12/13/2019 09:38   DG Hand Complete Right  Result Date: 12/13/2019 CLINICAL DATA:  Pain EXAM:  RIGHT HAND - COMPLETE 3+ VIEW COMPARISON:  None. FINDINGS: Frontal, oblique, and lateral views were obtained. There is no fracture or dislocation. Joint spaces appear normal. No erosive change. There is calcification in the scapholunate joint region as well as in the triangular fibrocartilage region. IMPRESSION: Calcification in the scapholunate joint region triangular fibrocartilage region may have arthropathic etiology but also may be of posttraumatic etiology. No acute fracture or dislocation. Joint spaces appear unremarkable elsewhere. No erosive change. Electronically Signed   By: Lowella Grip III M.D.   On: 12/13/2019 09:39    Procedures Procedures (including critical care time)  Medications Ordered in UC Medications - No data to display  Initial Impression / Assessment and Plan / UC Course  I have reviewed the triage vital signs and the nursing notes.  Pertinent labs & imaging results that were available during my care of the patient were reviewed by me and considered in my medical decision making (see chart for details).   Imaging of the hand and wrist obtained today due to patient's complaint of severe 9 out of 10 pain.  Imaging does show suspected calcifications and arthritis in the area that is tender. I reviewed all images personally.  Discussed this with patient and also advised her that the ganglion cyst could be playing a role in her pain if it is pressing on a nerve.  Advised wrist brace that has been brought up.  Offered anti-inflammatory medication prescription as well as her chronic pain medication at bedtime if absolutely needed.  Patient declines any prescriptions none agrees to take the brace.  Advised for her to follow-up with orthopedics if she is not getting better over the next couple weeks or if the pain worsens.   Final Clinical Impressions(s) / UC Diagnoses   Final  diagnoses:  Right wrist pain  Right hand pain  Osteoarthritis of right hand, unspecified osteoarthritis type  Ganglion of right wrist     Discharge Instructions     X-rays do not show any fractures.  You do have some degenerative changes in the area that you have pain.  On exam, I did note that you have a ganglion cyst near this region as well.  Your wrist pain is likely due to flareup of underlying osteoarthritis of the right hand, but there could be some involvement of the ganglion cyst putting pressure on the nerve.  I would advise using the hand/wrist brace.  Take anti-inflammatory medication and Tylenol for pain.  Ice the wrist to help with pain relief and try to keep it elevated.  If you have continued problems with this hand or wrist, please follow-up with orthopedics.  You have a condition requiring you to follow up with Orthopedics so please call one of the following office for appointment:   [facility] [address] [place], [place] [address] Phone: [phone]  [facility] [address], [place], [place] [address] Phone: [phone]     ED Prescriptions    None     I have reviewed the PDMP during this encounter.   Danton Clap, PA-C 12/13/19 1000

## 2019-12-18 ENCOUNTER — Ambulatory Visit: Payer: 59 | Admitting: Internal Medicine

## 2020-01-08 DIAGNOSIS — Z85828 Personal history of other malignant neoplasm of skin: Secondary | ICD-10-CM | POA: Diagnosis not present

## 2020-01-08 DIAGNOSIS — D2239 Melanocytic nevi of other parts of face: Secondary | ICD-10-CM | POA: Diagnosis not present

## 2020-01-08 DIAGNOSIS — L570 Actinic keratosis: Secondary | ICD-10-CM | POA: Diagnosis not present

## 2020-01-08 DIAGNOSIS — Z08 Encounter for follow-up examination after completed treatment for malignant neoplasm: Secondary | ICD-10-CM | POA: Diagnosis not present

## 2020-01-08 DIAGNOSIS — Z85820 Personal history of malignant melanoma of skin: Secondary | ICD-10-CM | POA: Diagnosis not present

## 2020-02-19 ENCOUNTER — Other Ambulatory Visit: Payer: Self-pay

## 2020-02-19 ENCOUNTER — Ambulatory Visit (INDEPENDENT_AMBULATORY_CARE_PROVIDER_SITE_OTHER): Payer: BC Managed Care – PPO

## 2020-02-19 DIAGNOSIS — Z23 Encounter for immunization: Secondary | ICD-10-CM | POA: Diagnosis not present

## 2020-05-01 ENCOUNTER — Encounter: Payer: 59 | Admitting: Internal Medicine

## 2020-05-05 ENCOUNTER — Ambulatory Visit (INDEPENDENT_AMBULATORY_CARE_PROVIDER_SITE_OTHER): Payer: BC Managed Care – PPO | Admitting: Internal Medicine

## 2020-05-05 ENCOUNTER — Encounter: Payer: Self-pay | Admitting: Internal Medicine

## 2020-05-05 ENCOUNTER — Other Ambulatory Visit: Payer: Self-pay

## 2020-05-05 VITALS — BP 100/72 | HR 62 | Temp 98.1°F | Ht 63.0 in | Wt 110.0 lb

## 2020-05-05 DIAGNOSIS — Z0000 Encounter for general adult medical examination without abnormal findings: Secondary | ICD-10-CM | POA: Diagnosis not present

## 2020-05-05 DIAGNOSIS — G43009 Migraine without aura, not intractable, without status migrainosus: Secondary | ICD-10-CM

## 2020-05-05 DIAGNOSIS — F5101 Primary insomnia: Secondary | ICD-10-CM | POA: Diagnosis not present

## 2020-05-05 DIAGNOSIS — Z1231 Encounter for screening mammogram for malignant neoplasm of breast: Secondary | ICD-10-CM | POA: Diagnosis not present

## 2020-05-05 DIAGNOSIS — E7800 Pure hypercholesterolemia, unspecified: Secondary | ICD-10-CM | POA: Diagnosis not present

## 2020-05-05 NOTE — Progress Notes (Signed)
Date:  05/05/2020   Name:  Kristi Murphy   DOB:  11-24-1960   MRN:  [id number]   Chief Complaint: Annual Exam (Breast exam no pap)  Kristi Murphy is a 60 y.o. female who presents today for her Complete Annual Exam. She feels well. She reports exercising walking X7 days a week. She reports she is sleeping poorly. Breast complaints none.  Mammogram: 11/2019 [facility] DEXA: none Pap smear: 04/2018 neg with cotesting Colonoscopy: 01/2012  Immunization History  Administered Date(s) Administered  . Influenza Inj Mdck Quad Pf 11/09/2018  . Influenza,inj,Quad PF,6+ Mos 11/27/2019  . Moderna Sars-Covid-2 Vaccination 05/08/2019, 05/29/2019, 12/23/2019  . Zoster Recombinat (Shingrix) 10/22/2019, 02/19/2020    Migraine  This is a recurrent problem. The pain quality is similar to prior headaches. Pertinent negatives include no abdominal pain, coughing, dizziness, fever, hearing loss, tinnitus or vomiting. She has tried triptans for the symptoms. The treatment provided significant relief.    Lab Results  Component Value Date   CREATININE 0.80 04/30/2019   BUN 15 04/30/2019   NA 140 04/30/2019   K 4.4 04/30/2019   CL 104 04/30/2019   CO2 25 04/30/2019   Lab Results  Component Value Date   CHOL 171 04/30/2019   HDL 59 04/30/2019   LDLCALC 103 (H) 04/30/2019   TRIG 46 04/30/2019   CHOLHDL 2.9 04/30/2019   Lab Results  Component Value Date   TSH 4.920 (H) 04/30/2019   Lab Results  Component Value Date   HGBA1C 5.5 10/04/2016   Lab Results  Component Value Date   WBC 3.7 04/30/2019   HGB 13.3 04/30/2019   HCT 39.9 04/30/2019   MCV 93 04/30/2019   PLT 308 04/30/2019   Lab Results  Component Value Date   ALT 20 04/30/2019   AST 25 04/30/2019   ALKPHOS 116 04/30/2019   BILITOT 0.4 04/30/2019     Review of Systems  Constitutional: Negative for chills, fatigue and fever.  HENT: Negative for congestion, hearing loss, tinnitus, trouble swallowing and voice change.   Eyes:  Negative for visual disturbance.  Respiratory: Negative for cough, chest tightness, shortness of breath and wheezing.   Cardiovascular: Negative for chest pain, palpitations and leg swelling.  Gastrointestinal: Negative for abdominal pain, constipation, diarrhea and vomiting.  Endocrine: Negative for polydipsia and polyuria.  Genitourinary: Negative for dysuria, frequency, genital sores, vaginal bleeding and vaginal discharge.  Musculoskeletal: Negative for arthralgias, gait problem and joint swelling.  Skin: Negative for color change and rash.  Neurological: Positive for headaches. Negative for dizziness, tremors and light-headedness.  Hematological: Negative for adenopathy. Does not bruise/bleed easily.  Psychiatric/Behavioral: Positive for sleep disturbance. Negative for dysphoric mood. The patient is not nervous/anxious.     Patient Active Problem List   Diagnosis Date Noted  . Trigger middle finger of right hand 04/17/2018  . Ganglion of right ankle 04/06/2017  . Hyperglycemia 10/04/2016  . Hx of melanoma of skin 10/04/2016  . Migraine without aura and responsive to treatment 10/04/2016  . Environmental and seasonal allergies 10/04/2016  . Primary osteoarthritis of left knee 10/04/2016    Allergies  Allergen Reactions  . Clindamycin/Lincomycin Hives    Past Surgical History:  Procedure Laterality Date  . ANKLE SURGERY     fracture  . COLONOSCOPY  01/15/2012   normal - 10 yr follow up  . melanoma Right 2011   removal-   . MENISCUS REPAIR Left 01/2016    Social History   Tobacco Use  . Smoking  status: Never Smoker  . Smokeless tobacco: Never Used  Vaping Use  . Vaping Use: Never used  Substance Use Topics  . Alcohol use: No  . Drug use: No     Medication list has been reviewed and updated.  Current Meds  Medication Sig  . Cholecalciferol (VITAMIN D3) 10 MCG (400 UNIT) CAPS Take 1 capsule by mouth daily.  . fluticasone (FLONASE) 50 MCG/ACT nasal spray Place  2 sprays into both nostrils daily.  . Multiple Vitamin (MULTIVITAMIN) tablet Take 1 tablet by mouth daily.  . Probiotic Product (PROBIOTIC PO) Take by mouth.  . rizatriptan (MAXALT) 10 MG tablet TAKE 1 TABLET (10 MG TOTAL) BY MOUTH AS NEEDED FOR MIGRAINE. MAY REPEAT IN 2 HOURS IF NEEDED    PHQ 2/9 Scores 05/05/2020 04/30/2019 04/17/2018 04/06/2017  PHQ - 2 Score 0 0 0 0  PHQ- 9 Score 0 0 0 -    GAD 7 : Generalized Anxiety Score 05/05/2020 04/30/2019  Nervous, Anxious, on Edge 0 0  Control/stop worrying 0 0  Worry too much - different things 0 0  Trouble relaxing 0 0  Restless 0 0  Easily annoyed or irritable 0 0  Afraid - awful might happen 0 0  Total GAD 7 Score 0 0  Anxiety Difficulty - Not difficult at all    BP Readings from Last 3 Encounters:  05/05/20 100/72  12/13/19 111/61  04/30/19 120/78    Physical Exam Vitals and nursing note reviewed.  Constitutional:      General: She is not in acute distress.    Appearance: She is well-developed.  HENT:     Head: Normocephalic and atraumatic.     Right Ear: Tympanic membrane and ear canal normal.     Left Ear: Tympanic membrane and ear canal normal.     Nose:     Right Sinus: No maxillary sinus tenderness.     Left Sinus: No maxillary sinus tenderness.  Eyes:     General: No scleral icterus.       Right eye: No discharge.        Left eye: No discharge.     Conjunctiva/sclera: Conjunctivae normal.  Neck:     Thyroid: No thyromegaly.     Vascular: No carotid bruit.  Cardiovascular:     Rate and Rhythm: Normal rate and regular rhythm.     Pulses: Normal pulses.     Heart sounds: Normal heart sounds.  Pulmonary:     Effort: Pulmonary effort is normal. No respiratory distress.     Breath sounds: No wheezing.  Chest:  Breasts:     Right: No mass, nipple discharge, skin change or tenderness.     Left: No mass, nipple discharge, skin change or tenderness.    Abdominal:     General: Bowel sounds are normal.     Palpations:  Abdomen is soft.     Tenderness: There is no abdominal tenderness.  Musculoskeletal:     Cervical back: Normal range of motion. No erythema.     Right lower leg: No edema.     Left lower leg: No edema.  Lymphadenopathy:     Cervical: No cervical adenopathy.  Skin:    General: Skin is warm and dry.     Findings: No rash.  Neurological:     Mental Status: She is alert and oriented to person, place, and time.     Cranial Nerves: No cranial nerve deficit.     Sensory: No sensory deficit.  Deep Tendon Reflexes: Reflexes are normal and symmetric.  Psychiatric:        Attention and Perception: Attention normal.        Mood and Affect: Mood normal.     Wt Readings from Last 3 Encounters:  05/05/20 110 lb (49.9 kg)  12/13/19 106 lb (48.1 kg)  04/30/19 110 lb (49.9 kg)    BP 100/72   Pulse 62   Temp 98.1 F (36.7 C) (Oral)   Ht 5\' 3"  (1.6 m)   Wt 110 lb (49.9 kg)   SpO2 98%   BMI 19.49 kg/m   Assessment and Plan: 1. Annual physical exam Normal exam Continue regular exercise, healthy diet - CBC with Differential/Platelet - Comprehensive metabolic panel - Lipid panel - TSH  2. Encounter for screening mammogram for breast cancer Schedule in [place] in October - MM 3D SCREEN BREAST BILATERAL; Future  3. Migraine without aura and responsive to treatment Responding well to triptans PRN  4. Primary insomnia Taking Benadryl PRN - recommend more regular use for improved sleep and energy   Partially dictated using Editor, commissioning. Any errors are unintentional.  Halina Maidens, MD [facility]  05/05/2020

## 2020-05-06 LAB — LIPID PANEL
Chol/HDL Ratio: 2.8 ratio (ref 0.0–4.4)
Cholesterol, Total: 160 mg/dL (ref 100–199)
HDL: 57 mg/dL (ref >39)
LDL Chol Calc (NIH): 91 mg/dL (ref 0–99)
Triglycerides: 61 mg/dL (ref 0–149)
VLDL Cholesterol Cal: 12 mg/dL (ref 5–40)

## 2020-05-06 LAB — CBC WITH DIFFERENTIAL/PLATELET
Basophils Absolute: 0.1 10*3/uL (ref 0.0–0.2)
Basos: 2 %
EOS (ABSOLUTE): 0.1 10*3/uL (ref 0.0–0.4)
Eos: 2 %
Hematocrit: 39.3 % (ref 34.0–46.6)
Hemoglobin: 12.7 g/dL (ref 11.1–15.9)
Immature Grans (Abs): 0 10*3/uL (ref 0.0–0.1)
Immature Granulocytes: 0 %
Lymphocytes Absolute: 1.5 10*3/uL (ref 0.7–3.1)
Lymphs: 40 %
MCH: 30.4 pg (ref 26.6–33.0)
MCHC: 32.3 g/dL (ref 31.5–35.7)
MCV: 94 fL (ref 79–97)
Monocytes Absolute: 0.5 10*3/uL (ref 0.1–0.9)
Monocytes: 12 %
Neutrophils Absolute: 1.7 10*3/uL (ref 1.4–7.0)
Neutrophils: 44 %
Platelets: 263 10*3/uL (ref 150–450)
RBC: 4.18 x10E6/uL (ref 3.77–5.28)
RDW: 12.4 % (ref 11.7–15.4)
WBC: 3.8 10*3/uL (ref 3.4–10.8)

## 2020-05-06 LAB — COMPREHENSIVE METABOLIC PANEL
ALT: 18 IU/L (ref 0–32)
AST: 20 IU/L (ref 0–40)
Albumin/Globulin Ratio: 1.5 (ref 1.2–2.2)
Albumin: 4.1 g/dL (ref 3.8–4.8)
Alkaline Phosphatase: 117 IU/L (ref 44–121)
BUN/Creatinine Ratio: 20 (ref 12–28)
BUN: 18 mg/dL (ref 8–27)
Bilirubin Total: 0.4 mg/dL (ref 0.0–1.2)
CO2: 23 mmol/L (ref 20–29)
Calcium: 9.6 mg/dL (ref 8.7–10.3)
Chloride: 105 mmol/L (ref 96–106)
Creatinine, Ser: 0.9 mg/dL (ref 0.57–1.00)
Globulin, Total: 2.7 g/dL (ref 1.5–4.5)
Glucose: 87 mg/dL (ref 65–99)
Potassium: 4.4 mmol/L (ref 3.5–5.2)
Sodium: 140 mmol/L (ref 134–144)
Total Protein: 6.8 g/dL (ref 6.0–8.5)
eGFR: 73 mL/min/{1.73_m2} (ref >59)

## 2020-05-06 LAB — TSH: TSH: 4.89 u[IU]/mL — ABNORMAL HIGH (ref 0.450–4.500)

## 2020-05-27 DIAGNOSIS — M25561 Pain in right knee: Secondary | ICD-10-CM | POA: Diagnosis not present

## 2020-05-27 DIAGNOSIS — M170 Bilateral primary osteoarthritis of knee: Secondary | ICD-10-CM | POA: Diagnosis not present

## 2020-05-27 DIAGNOSIS — M25562 Pain in left knee: Secondary | ICD-10-CM | POA: Diagnosis not present

## 2020-06-28 ENCOUNTER — Other Ambulatory Visit: Payer: Self-pay | Admitting: Internal Medicine

## 2020-06-28 DIAGNOSIS — J3089 Other allergic rhinitis: Secondary | ICD-10-CM

## 2020-06-30 NOTE — Telephone Encounter (Signed)
Requested Prescriptions  Pending Prescriptions Disp Refills  . fluticasone (FLONASE) 50 MCG/ACT nasal spray [Pharmacy Med Name: FLUTICASONE PROP 50 MCG SPRAY] 48 mL 4    Sig: SPRAY 2 SPRAYS INTO EACH NOSTRIL EVERY DAY     Ear, Nose, and Throat: Nasal Preparations - Corticosteroids Passed - 06/28/2020  9:26 AM      Passed - Valid encounter within last 12 months    Recent Outpatient Visits          1 month ago Annual physical exam   [facility] Glean Hess, MD   1 year ago Annual physical exam   [facility] Glean Hess, MD   2 years ago Annual physical exam   [facility] Glean Hess, MD   2 years ago Groin strain, left, initial encounter   [facility] Glean Hess, MD   2 years ago Cellulitis of right lower extremity   [facility] Glean Hess, MD      Future Appointments            In 10 months Army Melia Jesse Sans, MD [facility]

## 2020-07-08 DIAGNOSIS — L570 Actinic keratosis: Secondary | ICD-10-CM | POA: Diagnosis not present

## 2020-07-08 DIAGNOSIS — Z85820 Personal history of malignant melanoma of skin: Secondary | ICD-10-CM | POA: Diagnosis not present

## 2020-07-08 DIAGNOSIS — L578 Other skin changes due to chronic exposure to nonionizing radiation: Secondary | ICD-10-CM | POA: Diagnosis not present

## 2020-07-08 DIAGNOSIS — Z08 Encounter for follow-up examination after completed treatment for malignant neoplasm: Secondary | ICD-10-CM | POA: Diagnosis not present

## 2020-07-08 DIAGNOSIS — D485 Neoplasm of uncertain behavior of skin: Secondary | ICD-10-CM | POA: Diagnosis not present

## 2020-07-08 DIAGNOSIS — Z85828 Personal history of other malignant neoplasm of skin: Secondary | ICD-10-CM | POA: Diagnosis not present

## 2020-09-03 DIAGNOSIS — L578 Other skin changes due to chronic exposure to nonionizing radiation: Secondary | ICD-10-CM | POA: Diagnosis not present

## 2020-09-03 DIAGNOSIS — L814 Other melanin hyperpigmentation: Secondary | ICD-10-CM | POA: Diagnosis not present

## 2020-09-03 DIAGNOSIS — C44319 Basal cell carcinoma of skin of other parts of face: Secondary | ICD-10-CM | POA: Diagnosis not present

## 2020-09-03 DIAGNOSIS — Z85828 Personal history of other malignant neoplasm of skin: Secondary | ICD-10-CM | POA: Diagnosis not present

## 2020-10-23 ENCOUNTER — Other Ambulatory Visit: Payer: Self-pay | Admitting: Internal Medicine

## 2020-10-23 DIAGNOSIS — G43C0 Periodic headache syndromes in child or adult, not intractable: Secondary | ICD-10-CM

## 2020-10-23 NOTE — Telephone Encounter (Signed)
Medication refill

## 2020-10-23 NOTE — Telephone Encounter (Signed)
Requested medications are due for refill today yes  Requested medications are on the active medication list yes  Last refill 09/13/20  Last visit 05/2020  Future visit scheduled 05/2021  Notes to clinic Was given rx for 12 with 8 refills and to return in one year, has upcoming appt in April. Unsure if this rx was to last one year, please assess.

## 2020-11-30 ENCOUNTER — Ambulatory Visit
Admission: RE | Admit: 2020-11-30 | Discharge: 2020-11-30 | Disposition: A | Discharge status: Home / Self Care | Payer: BC Managed Care – PPO | Source: Ambulatory Visit | Attending: Internal Medicine | Admitting: Internal Medicine

## 2020-11-30 ENCOUNTER — Other Ambulatory Visit: Payer: Self-pay

## 2020-11-30 DIAGNOSIS — Z1231 Encounter for screening mammogram for malignant neoplasm of breast: Secondary | ICD-10-CM | POA: Diagnosis not present

## 2021-01-08 DIAGNOSIS — M170 Bilateral primary osteoarthritis of knee: Secondary | ICD-10-CM | POA: Diagnosis not present

## 2021-01-08 DIAGNOSIS — M11261 Other chondrocalcinosis, right knee: Secondary | ICD-10-CM | POA: Diagnosis not present

## 2021-01-08 DIAGNOSIS — M25561 Pain in right knee: Secondary | ICD-10-CM | POA: Diagnosis not present

## 2021-01-11 DIAGNOSIS — Z08 Encounter for follow-up examination after completed treatment for malignant neoplasm: Secondary | ICD-10-CM | POA: Diagnosis not present

## 2021-01-11 DIAGNOSIS — L570 Actinic keratosis: Secondary | ICD-10-CM | POA: Diagnosis not present

## 2021-01-11 DIAGNOSIS — L578 Other skin changes due to chronic exposure to nonionizing radiation: Secondary | ICD-10-CM | POA: Diagnosis not present

## 2021-01-11 DIAGNOSIS — Z85820 Personal history of malignant melanoma of skin: Secondary | ICD-10-CM | POA: Diagnosis not present

## 2021-01-11 DIAGNOSIS — Z85828 Personal history of other malignant neoplasm of skin: Secondary | ICD-10-CM | POA: Diagnosis not present

## 2021-01-19 DIAGNOSIS — M25561 Pain in right knee: Secondary | ICD-10-CM | POA: Diagnosis not present

## 2021-01-19 DIAGNOSIS — M6740 Ganglion, unspecified site: Secondary | ICD-10-CM | POA: Diagnosis not present

## 2021-01-19 DIAGNOSIS — S83281A Other tear of lateral meniscus, current injury, right knee, initial encounter: Secondary | ICD-10-CM | POA: Diagnosis not present

## 2021-01-19 DIAGNOSIS — S76311A Strain of muscle, fascia and tendon of the posterior muscle group at thigh level, right thigh, initial encounter: Secondary | ICD-10-CM | POA: Diagnosis not present

## 2021-01-19 DIAGNOSIS — S83241A Other tear of medial meniscus, current injury, right knee, initial encounter: Secondary | ICD-10-CM | POA: Diagnosis not present

## 2021-01-19 DIAGNOSIS — S83206A Unspecified tear of unspecified meniscus, current injury, right knee, initial encounter: Secondary | ICD-10-CM

## 2021-01-19 HISTORY — DX: Unspecified tear of unspecified meniscus, current injury, right knee, initial encounter: S83.206A

## 2021-02-11 ENCOUNTER — Telehealth: Payer: Self-pay

## 2021-02-11 DIAGNOSIS — M1711 Unilateral primary osteoarthritis, right knee: Secondary | ICD-10-CM | POA: Diagnosis not present

## 2021-02-11 DIAGNOSIS — S83271A Complex tear of lateral meniscus, current injury, right knee, initial encounter: Secondary | ICD-10-CM | POA: Diagnosis not present

## 2021-02-11 DIAGNOSIS — M25561 Pain in right knee: Secondary | ICD-10-CM | POA: Diagnosis not present

## 2021-02-11 DIAGNOSIS — S83221A Peripheral tear of medial meniscus, current injury, right knee, initial encounter: Secondary | ICD-10-CM | POA: Diagnosis not present

## 2021-02-11 NOTE — Telephone Encounter (Signed)
Received pre-op evaluation from [facility]. Called pt to make an appointment.  Pt she stated she was unsure of when she would get the procedure done that she would call back to schedule appt for her pre-op evaluation.  KP

## 2021-02-16 ENCOUNTER — Telehealth: Payer: Self-pay

## 2021-02-16 ENCOUNTER — Telehealth: Payer: Self-pay | Admitting: Internal Medicine

## 2021-02-16 NOTE — Telephone Encounter (Signed)
Copied from [place] [id number]. Topic: General - Other >> Feb 16, 2021 11:12 AM Valere Dross wrote: Reason for CRM: Pt called in wanting to speak with Chassidy concerning a pre op appt, and requested to speak with her, please advise.

## 2021-02-16 NOTE — Telephone Encounter (Signed)
Copied from [place] [id number]. Topic: General - Inquiry >> Feb 16, 2021  8:57 AM Alanda Slim E wrote: Reason for CRM: Pt has order from a surgeon to have blood work and an EKG done at Dr. Oneal Deputy office / please advise if pt needs an appt with Dr. Army Melia or a lab appt

## 2021-02-17 ENCOUNTER — Other Ambulatory Visit: Payer: Self-pay

## 2021-02-17 ENCOUNTER — Ambulatory Visit (INDEPENDENT_AMBULATORY_CARE_PROVIDER_SITE_OTHER): Payer: BC Managed Care – PPO | Admitting: Internal Medicine

## 2021-02-17 ENCOUNTER — Encounter: Payer: Self-pay | Admitting: Internal Medicine

## 2021-02-17 VITALS — BP 118/70 | HR 80 | Ht 63.0 in | Wt 112.0 lb

## 2021-02-17 DIAGNOSIS — S83203D Other tear of unspecified meniscus, current injury, right knee, subsequent encounter: Secondary | ICD-10-CM | POA: Diagnosis not present

## 2021-02-17 DIAGNOSIS — Z01818 Encounter for other preprocedural examination: Secondary | ICD-10-CM

## 2021-02-17 DIAGNOSIS — G43009 Migraine without aura, not intractable, without status migrainosus: Secondary | ICD-10-CM | POA: Diagnosis not present

## 2021-02-17 NOTE — Progress Notes (Signed)
Date:  02/17/2021   Name:  Kristi Murphy   DOB:  08-06-1960   MRN:  [id number]   Chief Complaint: Pre-op Exam Plan for right mensicus repair arthroscopically.    She feels well without chest pain, SOB, dizziness.  She has recurrent headaches for which she take Maxalt.  Sometimes the HA lasts for 5 days and she has to take recurrent doses of Maxalt.  She is wondering if she can get more tablets per month.  HPI  Lab Results  Component Value Date   NA 140 05/05/2020   K 4.4 05/05/2020   CO2 23 05/05/2020   GLUCOSE 87 05/05/2020   BUN 18 05/05/2020   CREATININE 0.90 05/05/2020   CALCIUM 9.6 05/05/2020   EGFR 73 05/05/2020   GFRNONAA 80 04/30/2019   Lab Results  Component Value Date   CHOL 160 05/05/2020   HDL 57 05/05/2020   LDLCALC 91 05/05/2020   TRIG 61 05/05/2020   CHOLHDL 2.8 05/05/2020   Lab Results  Component Value Date   TSH 4.890 (H) 05/05/2020   Lab Results  Component Value Date   HGBA1C 5.5 10/04/2016   Lab Results  Component Value Date   WBC 3.8 05/05/2020   HGB 12.7 05/05/2020   HCT 39.3 05/05/2020   MCV 94 05/05/2020   PLT 263 05/05/2020   Lab Results  Component Value Date   ALT 18 05/05/2020   AST 20 05/05/2020   ALKPHOS 117 05/05/2020   BILITOT 0.4 05/05/2020   No results found for: 25OHVITD2, 25OHVITD3, VD25OH   Review of Systems  Constitutional:  Negative for chills, fatigue and fever.  Respiratory:  Negative for chest tightness and shortness of breath.   Cardiovascular:  Negative for chest pain, palpitations and leg swelling.  Musculoskeletal:  Positive for arthralgias.  Neurological:  Positive for headaches. Negative for dizziness, syncope, weakness, light-headedness and numbness.  Psychiatric/Behavioral:  Negative for dysphoric mood and sleep disturbance. The patient is not nervous/anxious.    Patient Active Problem List   Diagnosis Date Noted   Trigger middle finger of right hand 04/17/2018   Ganglion of right ankle 04/06/2017    Hyperglycemia 10/04/2016   Hx of melanoma of skin 10/04/2016   Migraine without aura and responsive to treatment 10/04/2016   Environmental and seasonal allergies 10/04/2016   Primary osteoarthritis of left knee 10/04/2016    Allergies  Allergen Reactions   Clindamycin/Lincomycin Hives    Past Surgical History:  Procedure Laterality Date   ANKLE SURGERY     fracture   COLONOSCOPY  01/15/2012   normal - 10 yr follow up   melanoma Right 2011   removal-    MENISCUS REPAIR Left 01/2016    Social History   Tobacco Use   Smoking status: Never   Smokeless tobacco: Never  Vaping Use   Vaping Use: Never used  Substance Use Topics   Alcohol use: No   Drug use: No     Medication list has been reviewed and updated.  Current Meds  Medication Sig   Cholecalciferol (VITAMIN D3) 10 MCG (400 UNIT) CAPS Take 1 capsule by mouth daily.   fluticasone (FLONASE) 50 MCG/ACT nasal spray SPRAY 2 SPRAYS INTO EACH NOSTRIL EVERY DAY   meloxicam (MOBIC) 7.5 MG tablet Take 7.5 mg by mouth daily.   Multiple Vitamin (MULTIVITAMIN) tablet Take 1 tablet by mouth daily.   Probiotic Product (PROBIOTIC PO) Take by mouth.   rizatriptan (MAXALT) 10 MG tablet TAKE 1 TABLET (10 MG  TOTAL) BY MOUTH AS NEEDED FOR MIGRAINE. MAY REPEAT IN 2 HOURS IF NEEDED    PHQ 2/9 Scores 02/17/2021 05/05/2020 04/30/2019 04/17/2018  PHQ - 2 Score 0 0 0 0  PHQ- 9 Score 1 0 0 0    GAD 7 : Generalized Anxiety Score 02/17/2021 05/05/2020 04/30/2019  Nervous, Anxious, on Edge 0 0 0  Control/stop worrying 0 0 0  Worry too much - different things 0 0 0  Trouble relaxing 0 0 0  Restless 0 0 0  Easily annoyed or irritable 0 0 0  Afraid - awful might happen 0 0 0  Total GAD 7 Score 0 0 0  Anxiety Difficulty Not difficult at all - Not difficult at all    BP Readings from Last 3 Encounters:  02/17/21 118/70  05/05/20 100/72  12/13/19 111/61    Physical Exam Vitals and nursing note reviewed.  Constitutional:      General: She  is not in acute distress.    Appearance: Normal appearance. She is well-developed.  HENT:     Head: Normocephalic and atraumatic.  Cardiovascular:     Rate and Rhythm: Normal rate and regular rhythm.     Pulses: Normal pulses.     Heart sounds: No murmur heard. Pulmonary:     Effort: Pulmonary effort is normal. No respiratory distress.     Breath sounds: No wheezing or rhonchi.  Musculoskeletal:     Cervical back: Normal range of motion.     Right lower leg: No edema.     Left lower leg: No edema.  Lymphadenopathy:     Cervical: No cervical adenopathy.  Skin:    General: Skin is warm and dry.     Findings: No rash.  Neurological:     General: No focal deficit present.     Mental Status: She is alert and oriented to person, place, and time.  Psychiatric:        Mood and Affect: Mood normal.        Behavior: Behavior normal.    Wt Readings from Last 3 Encounters:  02/17/21 112 lb (50.8 kg)  05/05/20 110 lb (49.9 kg)  12/13/19 106 lb (48.1 kg)    BP 118/70    Pulse 80    Ht 5' 3"  (1.6 m)    Wt 112 lb (50.8 kg)    SpO2 96%    BMI 19.84 kg/m   Assessment and Plan: 1. Pre-op examination Normal exam - cleared for surgery. - EKG 12-Lead - SR @ 69, left axis, otherwise normal. - CBC with Differential/Platelet - Basic metabolic panel  2. Other tear of meniscus of right knee, unspecified meniscus, unspecified whether old or current tear, subsequent encounter Cleared to proceed with planned surgery.  3. Migraine without aura and responsive to treatment Samples of Ubrelvy 100 mg and Nurtec 75 mg given with instructions for use. She is reminded to limit Maxalt to 2 tabs in 24 hours.   Partially dictated using Editor, commissioning. Any errors are unintentional.  Halina Maidens, MD [facility]  02/17/2021

## 2021-02-17 NOTE — Telephone Encounter (Signed)
Pt had appt today 02/17/2021.  KP

## 2021-02-18 LAB — CBC WITH DIFFERENTIAL/PLATELET
Basophils Absolute: 0.1 10*3/uL (ref 0.0–0.2)
Basos: 1 %
EOS (ABSOLUTE): 0.1 10*3/uL (ref 0.0–0.4)
Eos: 2 %
Hematocrit: 37.9 % (ref 34.0–46.6)
Hemoglobin: 12.6 g/dL (ref 11.1–15.9)
Immature Grans (Abs): 0 10*3/uL (ref 0.0–0.1)
Immature Granulocytes: 0 %
Lymphocytes Absolute: 2 10*3/uL (ref 0.7–3.1)
Lymphs: 37 %
MCH: 30.7 pg (ref 26.6–33.0)
MCHC: 33.2 g/dL (ref 31.5–35.7)
MCV: 92 fL (ref 79–97)
Monocytes Absolute: 0.4 10*3/uL (ref 0.1–0.9)
Monocytes: 7 %
Neutrophils Absolute: 2.9 10*3/uL (ref 1.4–7.0)
Neutrophils: 53 %
Platelets: 290 10*3/uL (ref 150–450)
RBC: 4.11 x10E6/uL (ref 3.77–5.28)
RDW: 12 % (ref 11.7–15.4)
WBC: 5.5 10*3/uL (ref 3.4–10.8)

## 2021-02-18 LAB — BASIC METABOLIC PANEL
BUN/Creatinine Ratio: 22 (ref 12–28)
BUN: 19 mg/dL (ref 8–27)
CO2: 26 mmol/L (ref 20–29)
Calcium: 10.2 mg/dL (ref 8.7–10.3)
Chloride: 103 mmol/L (ref 96–106)
Creatinine, Ser: 0.88 mg/dL (ref 0.57–1.00)
Glucose: 65 mg/dL — ABNORMAL LOW (ref 70–99)
Potassium: 4.4 mmol/L (ref 3.5–5.2)
Sodium: 144 mmol/L (ref 134–144)
eGFR: 74 mL/min/{1.73_m2} (ref >59)

## 2021-02-22 ENCOUNTER — Encounter: Payer: Self-pay | Admitting: Internal Medicine

## 2021-02-23 ENCOUNTER — Other Ambulatory Visit: Payer: Self-pay

## 2021-02-23 MED ORDER — TRETINOIN 0.1 % EX CREA: TOPICAL_CREAM | Freq: Every day | CUTANEOUS | 0 refills | Status: AC

## 2021-04-01 IMAGING — MG DIGITAL SCREENING BILAT W/ TOMO W/ CAD
8 series · 9 of 24 positions shown · non-contrast
Comparison: Previous exam(s).

CLINICAL DATA: Screening.

EXAM:
DIGITAL SCREENING BILATERAL MAMMOGRAM WITH TOMO AND CAD

[L CC synth-2D]
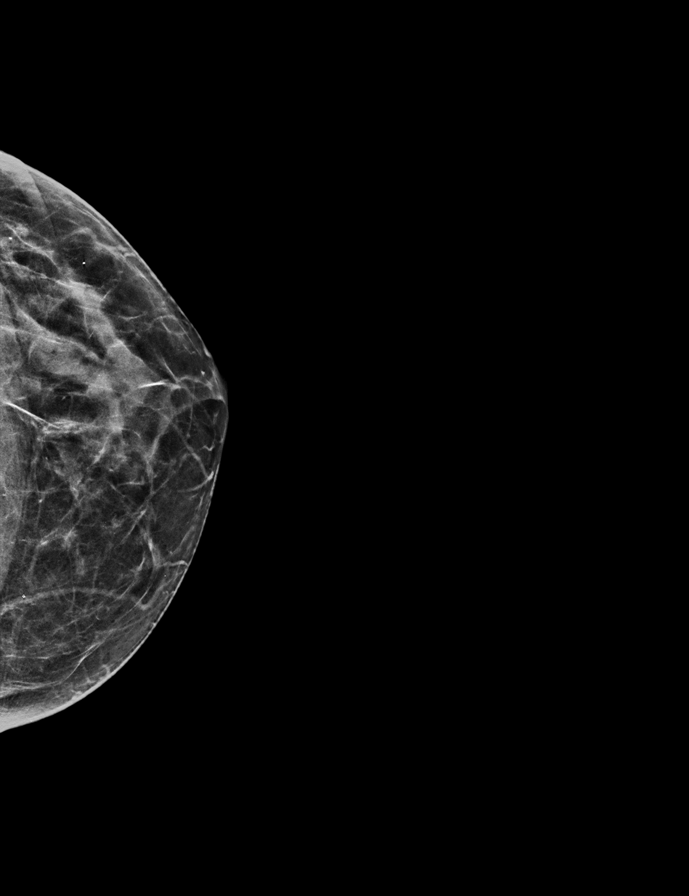

[L MLO synth-2D]
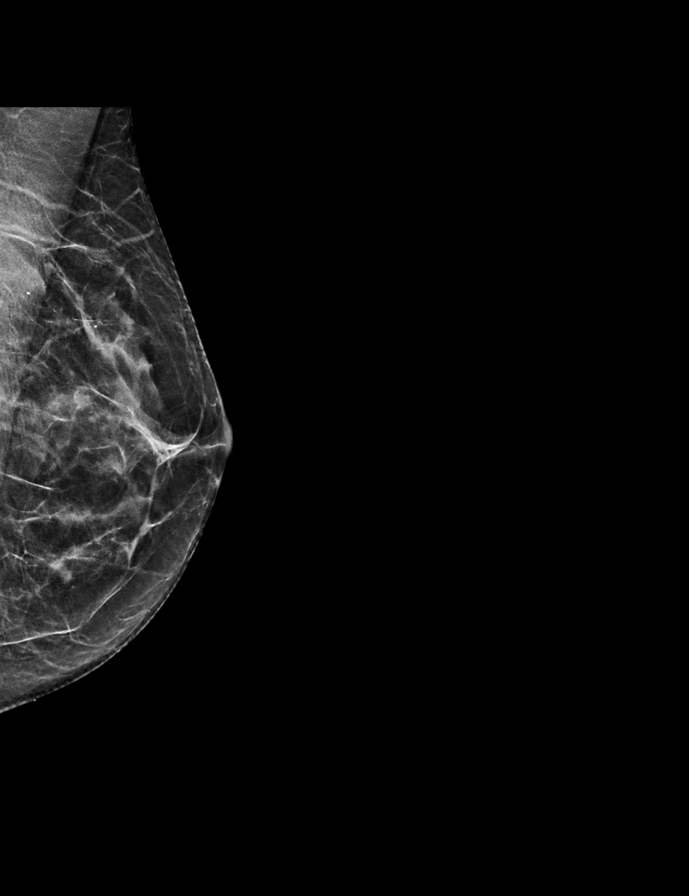

[R MLO synth-2D]
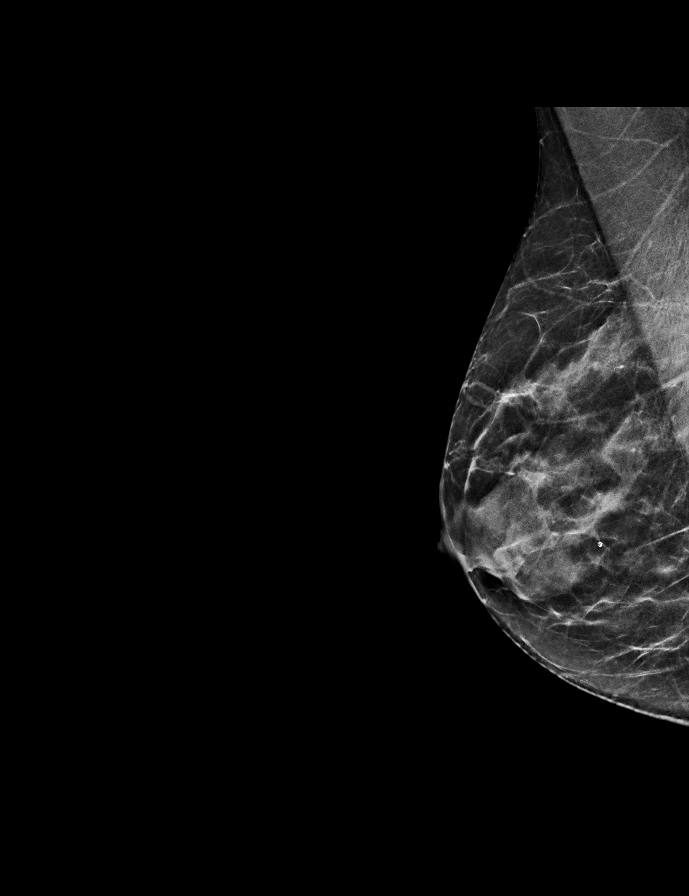

[R CC synth-2D]
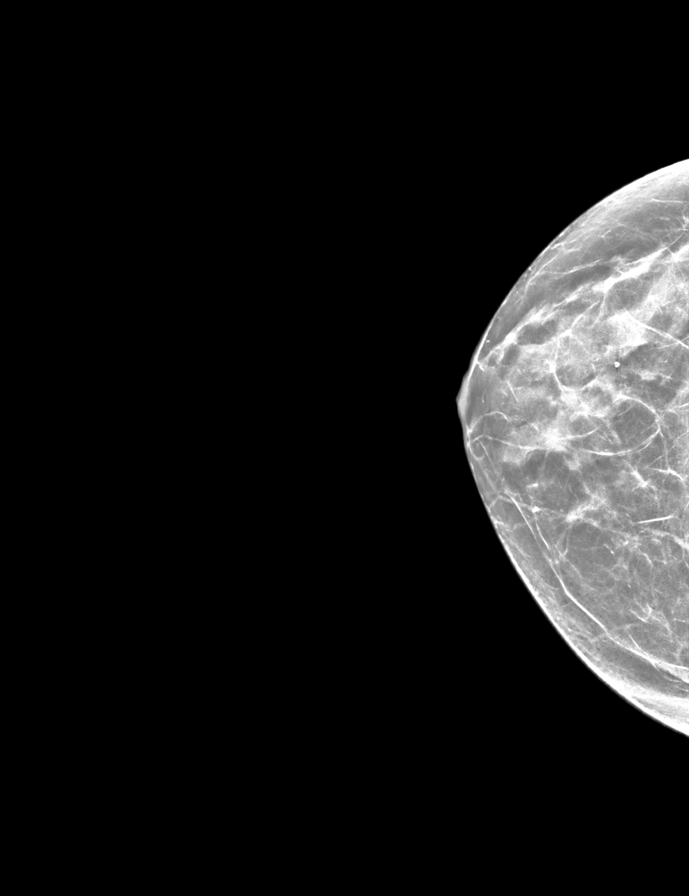

[L MLO tomo · 2 of 47 frames shown]
[frame 16/47]
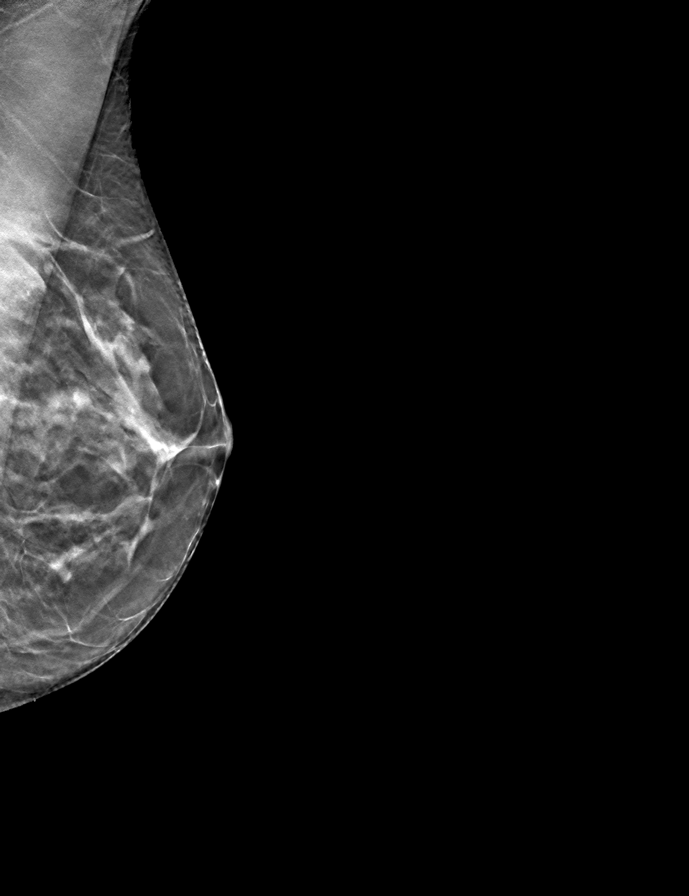
[frame 24/47]
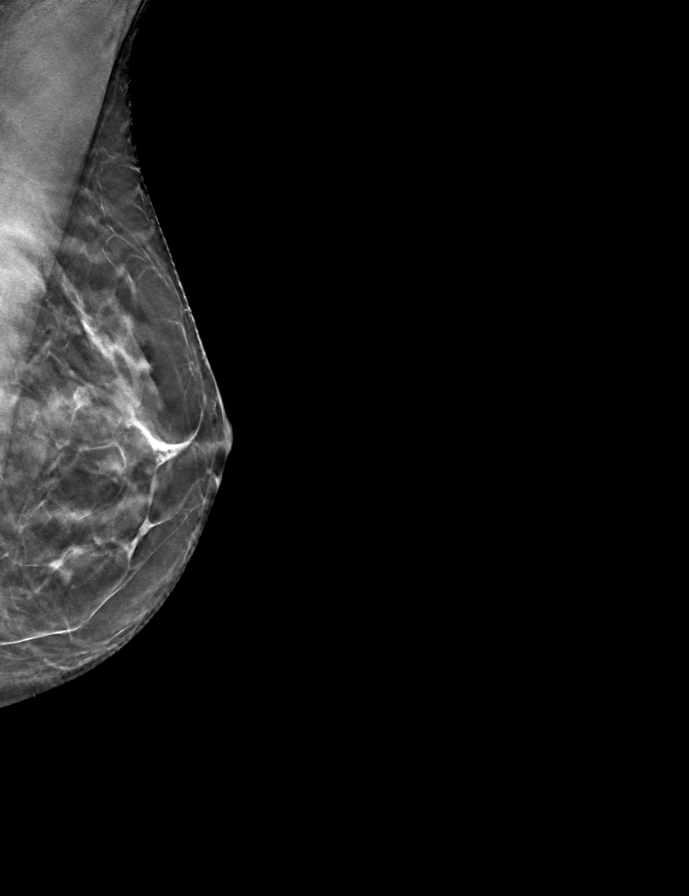

[R CC tomo · tomo slice 25/48.0]
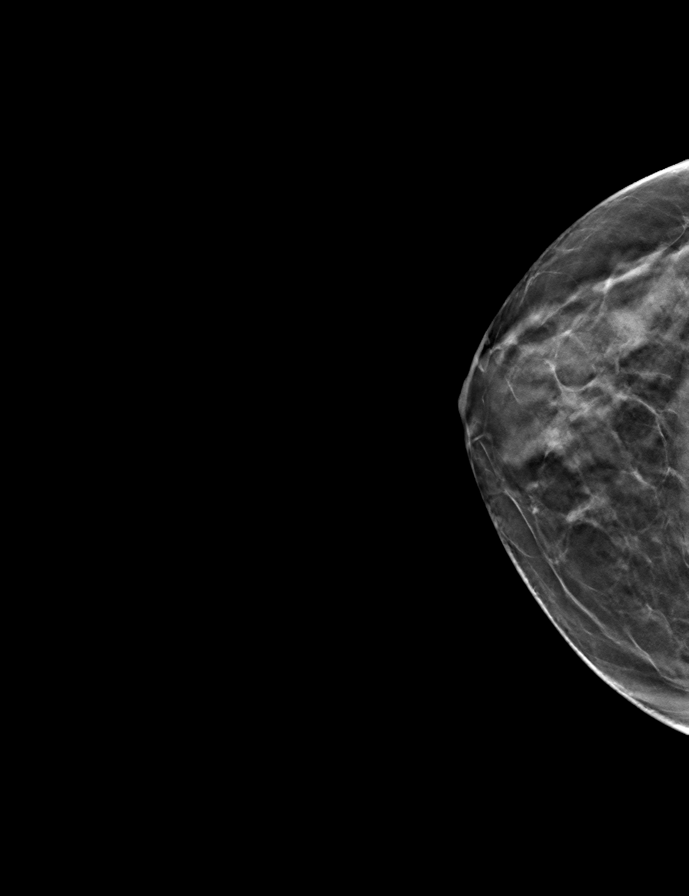

[L CC tomo · tomo slice 25/49.0]
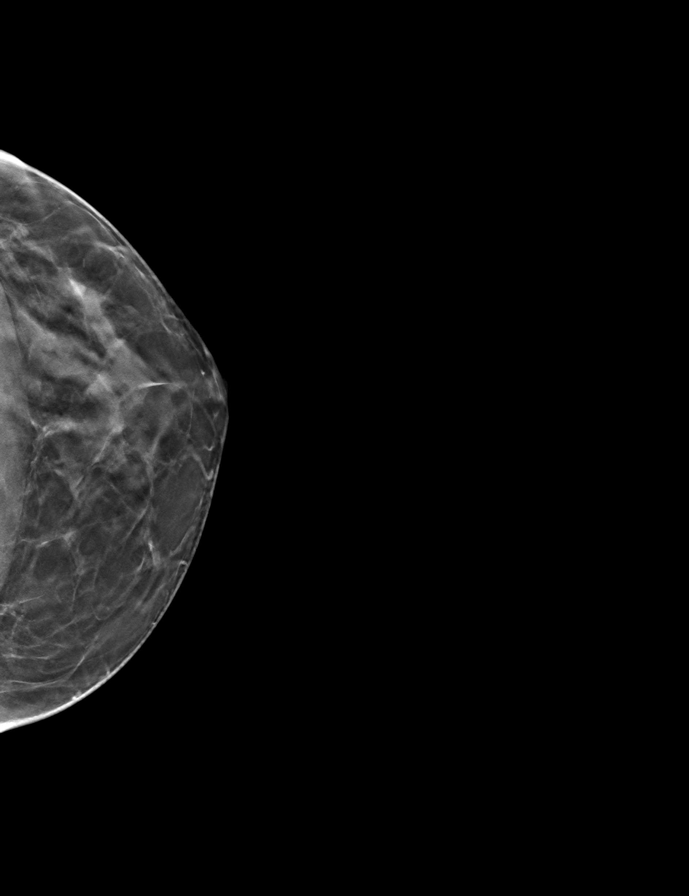

[R MLO tomo · tomo slice 23/46.0]
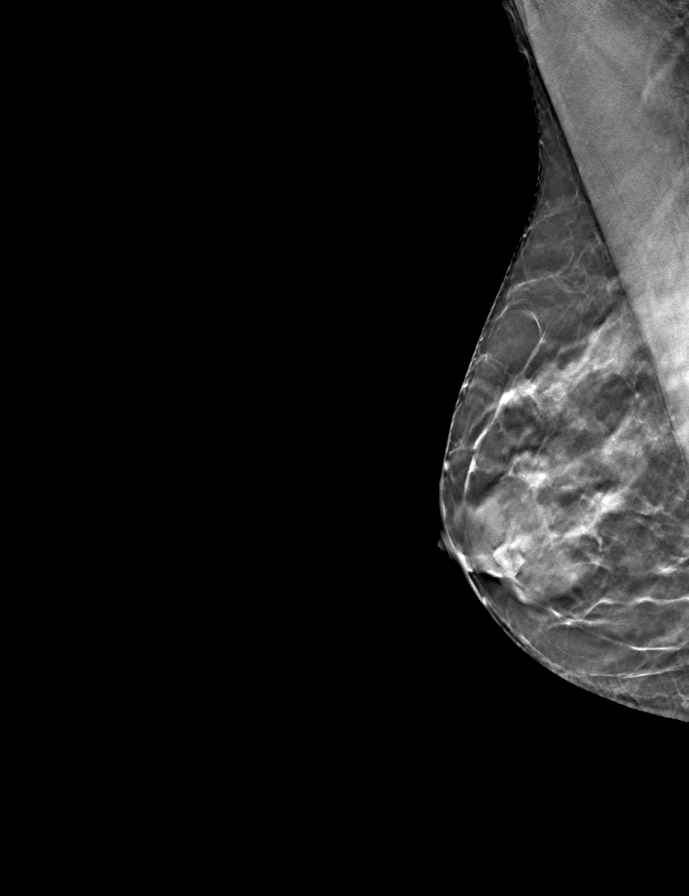

[9 of 24 positions shown; findings below may reference images not displayed]

ACR Breast Density Category c: The breast tissue is heterogeneously
dense, which may obscure small masses.
FINDINGS: There are no findings suspicious for malignancy. Images were
processed with CAD.
IMPRESSION: No mammographic evidence of malignancy. A result letter of this
screening mammogram will be mailed directly to the patient.

RECOMMENDATION:
Screening mammogram in one year. (Code:[id number]-[id number])

BI-RADS CATEGORY  1: Negative.

## 2021-05-12 ENCOUNTER — Encounter: Payer: BC Managed Care – PPO | Admitting: Internal Medicine

## 2021-05-25 ENCOUNTER — Encounter: Payer: BC Managed Care – PPO | Admitting: Internal Medicine

## 2021-06-09 ENCOUNTER — Encounter: Payer: Self-pay | Admitting: Internal Medicine

## 2021-06-09 ENCOUNTER — Other Ambulatory Visit: Payer: Self-pay

## 2021-06-09 ENCOUNTER — Ambulatory Visit (INDEPENDENT_AMBULATORY_CARE_PROVIDER_SITE_OTHER): Payer: BC Managed Care – PPO | Admitting: Internal Medicine

## 2021-06-09 VITALS — BP 94/66 | HR 64 | Ht 63.0 in | Wt 108.6 lb

## 2021-06-09 DIAGNOSIS — R202 Paresthesia of skin: Secondary | ICD-10-CM

## 2021-06-09 DIAGNOSIS — R7989 Other specified abnormal findings of blood chemistry: Secondary | ICD-10-CM | POA: Diagnosis not present

## 2021-06-09 DIAGNOSIS — Z1231 Encounter for screening mammogram for malignant neoplasm of breast: Secondary | ICD-10-CM

## 2021-06-09 DIAGNOSIS — R200 Anesthesia of skin: Secondary | ICD-10-CM

## 2021-06-09 DIAGNOSIS — Z1211 Encounter for screening for malignant neoplasm of colon: Secondary | ICD-10-CM

## 2021-06-09 DIAGNOSIS — Z0000 Encounter for general adult medical examination without abnormal findings: Secondary | ICD-10-CM

## 2021-06-09 DIAGNOSIS — G43C0 Periodic headache syndromes in child or adult, not intractable: Secondary | ICD-10-CM

## 2021-06-09 MED ORDER — PEG 3350-KCL-NA BICARB-NACL 420 G PO SOLR: 4000.0000 mL | Freq: Once | ORAL | 0 refills | Status: AC

## 2021-06-09 MED ORDER — RIZATRIPTAN BENZOATE 10 MG PO TABS: 10.0000 mg | ORAL_TABLET | ORAL | 12 refills | Status: DC | PRN

## 2021-06-09 NOTE — Progress Notes (Signed)
? ? ?Date:  06/09/2021  ? ?Name:  Kristi Murphy   DOB:  09-11-1960   MRN:  [id number] ? ? ?Chief Complaint: Annual Exam and Colon Cancer Screening ([place] [facility] in [place] was last Colonoscopy.) ?Kristi Murphy is a 61 y.o. female who presents today for her Complete Annual Exam. She feels well. She reports exercising - aerobics and hiking.  She reports she is sleeping fairly well. Breast complaints - none. ? ?Mammogram: 10/2020 ?DEXA: none ?Pap smear: 04/2018 neg with co-testing ?Colonoscopy: 01/2012 due  ? ?There are no preventive care reminders to display for this patient.  ?Immunization History  ?Administered Date(s) Administered  ? Influenza Inj Mdck Quad Pf 11/09/2018  ? Influenza,inj,Quad PF,6+ Mos 11/27/2019, 11/09/2020  ? Moderna Sars-Covid-2 Vaccination 05/08/2019, 05/29/2019, 12/23/2019  ? PFIZER Comirnaty(Gray Top)Covid-19 Tri-Sucrose Vaccine 06/08/2020  ? Pension scheme manager 81yr & up 01/11/2021  ? Zoster Recombinat (Shingrix) 10/22/2019, 02/19/2020  ? ? ?HPI ? ?Lab Results  ?Component Value Date  ? NA 144 02/17/2021  ? K 4.4 02/17/2021  ? CO2 26 02/17/2021  ? GLUCOSE 65 (L) 02/17/2021  ? BUN 19 02/17/2021  ? CREATININE 0.88 02/17/2021  ? CALCIUM 10.2 02/17/2021  ? EGFR 74 02/17/2021  ? GFRNONAA 80 04/30/2019  ? ?Lab Results  ?Component Value Date  ? CHOL 160 05/05/2020  ? HDL 57 05/05/2020  ? [place]91 05/05/2020  ? TRIG 61 05/05/2020  ? CHOLHDL 2.8 05/05/2020  ? ?Lab Results  ?Component Value Date  ? TSH 4.890 (H) 05/05/2020  ? ?Lab Results  ?Component Value Date  ? HGBA1C 5.5 10/04/2016  ? ?Lab Results  ?Component Value Date  ? WBC 5.5 02/17/2021  ? HGB 12.6 02/17/2021  ? HCT 37.9 02/17/2021  ? MCV 92 02/17/2021  ? PLT 290 02/17/2021  ? ?Lab Results  ?Component Value Date  ? ALT 18 05/05/2020  ? AST 20 05/05/2020  ? ALKPHOS 117 05/05/2020  ? BILITOT 0.4 05/05/2020  ? ?No results found for: 25OHVITD2, 2[place] VD25OH  ? ?Review of Systems  ?Constitutional:   Negative for chills, fatigue and fever.  ?HENT:  Negative for congestion, hearing loss, tinnitus, trouble swallowing and voice change.   ?Eyes:  Negative for visual disturbance.  ?Respiratory:  Negative for cough, chest tightness, shortness of breath and wheezing.   ?Cardiovascular:  Negative for chest pain, palpitations and leg swelling.  ?Gastrointestinal:  Negative for abdominal pain, constipation, diarrhea and vomiting.  ?Endocrine: Negative for polydipsia and polyuria.  ?Genitourinary:  Negative for dysuria, frequency, genital sores, vaginal bleeding and vaginal discharge.  ?Musculoskeletal:  Positive for arthralgias. Negative for gait problem and joint swelling.  ?Skin:  Negative for color change and rash.  ?Neurological:  Positive for numbness (intermittent numbness of toes on right) and headaches. Negative for dizziness, tremors and light-headedness.  ?Hematological:  Negative for adenopathy. Does not bruise/bleed easily.  ?Psychiatric/Behavioral:  Negative for dysphoric mood and sleep disturbance. The patient is not nervous/anxious.   ? ?Patient Active Problem List  ? Diagnosis Date Noted  ? Acute torn meniscus of knee, right, initial encounter 01/19/2021  ? Trigger middle finger of right hand 04/17/2018  ? Ganglion of right ankle 04/06/2017  ? Hyperglycemia 10/04/2016  ? Hx of melanoma of skin 10/04/2016  ? Migraine without aura and responsive to treatment 10/04/2016  ? Environmental and seasonal allergies 10/04/2016  ? Primary osteoarthritis of left knee 10/04/2016  ? ? ?Allergies  ?Allergen Reactions  ? Clindamycin/Lincomycin Hives  ? ? ?  Past Surgical History:  ?Procedure Laterality Date  ? ANKLE SURGERY    ? fracture  ? COLONOSCOPY  01/15/2012  ? normal - 10 yr follow up  ? melanoma Right 2011  ? removal-   ? MENISCUS REPAIR Left 01/2016  ? ? ?Social History  ? ?Tobacco Use  ? Smoking status: Never  ? Smokeless tobacco: Never  ?Vaping Use  ? Vaping Use: Never used  ?Substance Use Topics  ? Alcohol use:  No  ? Drug use: No  ? ? ? ?Medication list has been reviewed and updated. ? ?Current Meds  ?Medication Sig  ? Cholecalciferol (VITAMIN D3) 10 MCG (400 UNIT) CAPS Take 1 capsule by mouth daily.  ? fluticasone (FLONASE) 50 MCG/ACT nasal spray SPRAY 2 SPRAYS INTO EACH NOSTRIL EVERY DAY  ? meloxicam (MOBIC) 7.5 MG tablet Take 7.5 mg by mouth daily as needed.  ? Multiple Vitamin (MULTIVITAMIN) tablet Take 1 tablet by mouth daily.  ? Probiotic Product (PROBIOTIC PO) Take by mouth.  ? rizatriptan (MAXALT) 10 MG tablet TAKE 1 TABLET (10 MG TOTAL) BY MOUTH AS NEEDED FOR MIGRAINE. MAY REPEAT IN 2 HOURS IF NEEDED  ? tretinoin (RETIN-A) 0.1 % cream Apply topically at bedtime. Given by patients Dermatologist. Add IN Medication. No refill.  ? ? ? ?  02/17/2021  ?  1:57 PM 05/05/2020  ?  8:34 AM 04/30/2019  ?  8:35 AM  ?GAD 7 : Generalized Anxiety Score  ?Nervous, Anxious, on Edge 0 0 0  ?Control/stop worrying 0 0 0  ?Worry too much - different things 0 0 0  ?Trouble relaxing 0 0 0  ?Restless 0 0 0  ?Easily annoyed or irritable 0 0 0  ?Afraid - awful might happen 0 0 0  ?Total GAD 7 Score 0 0 0  ?Anxiety Difficulty Not difficult at all  Not difficult at all  ? ? ? ?  02/17/2021  ?  1:57 PM  ?Depression screen PHQ 2/9  ?Decreased Interest 0  ?Down, Depressed, Hopeless 0  ?PHQ - 2 Score 0  ?Altered sleeping 1  ?Tired, decreased energy 0  ?Change in appetite 0  ?Feeling bad or failure about yourself  0  ?Trouble concentrating 0  ?Moving slowly or fidgety/restless 0  ?Suicidal thoughts 0  ?PHQ-9 Score 1  ?Difficult doing work/chores Not difficult at all  ? ? ?BP Readings from Last 3 Encounters:  ?06/09/21 94/66  ?02/17/21 118/70  ?05/05/20 100/72  ? ? ?Physical Exam ?Vitals and nursing note reviewed.  ?Constitutional:   ?   General: She is not in acute distress. ?   Appearance: She is well-developed.  ?HENT:  ?   Head: Normocephalic and atraumatic.  ?   Right Ear: Tympanic membrane and ear canal normal.  ?   Left Ear: Tympanic membrane and  ear canal normal.  ?   Nose:  ?   Right Sinus: No maxillary sinus tenderness.  ?   Left Sinus: No maxillary sinus tenderness.  ?Eyes:  ?   General: No scleral icterus.    ?   Right eye: No discharge.     ?   Left eye: No discharge.  ?   Conjunctiva/sclera: Conjunctivae normal.  ?Neck:  ?   Thyroid: No thyromegaly.  ?   Vascular: No carotid bruit.  ?Cardiovascular:  ?   Rate and Rhythm: Normal rate and regular rhythm.  ?   Pulses: Normal pulses.  ?   Heart sounds: Normal heart sounds.  ?Pulmonary:  ?  Effort: Pulmonary effort is normal. No respiratory distress.  ?   Breath sounds: No wheezing.  ?Chest:  ?Breasts: ?   Right: No mass, nipple discharge, skin change or tenderness.  ?   Left: No mass, nipple discharge, skin change or tenderness.  ?Abdominal:  ?   General: Bowel sounds are normal.  ?   Palpations: Abdomen is soft.  ?   Tenderness: There is no abdominal tenderness.  ?Musculoskeletal:     ?   General: Normal range of motion.  ?   Cervical back: Normal range of motion. No erythema.  ?   Right lower leg: No edema.  ?   Left lower leg: No edema.  ?Lymphadenopathy:  ?   Cervical: No cervical adenopathy.  ?Skin: ?   General: Skin is warm and dry.  ?   Capillary Refill: Capillary refill takes less than 2 seconds.  ?   Findings: No rash.  ?Neurological:  ?   General: No focal deficit present.  ?   Mental Status: She is alert and oriented to person, place, and time.  ?   Cranial Nerves: No cranial nerve deficit.  ?   Sensory: No sensory deficit.  ?   Deep Tendon Reflexes: Reflexes are normal and symmetric.  ?Psychiatric:     ?   Attention and Perception: Attention normal.     ?   Mood and Affect: Mood normal.  ? ? ?Wt Readings from Last 3 Encounters:  ?06/09/21 108 lb 9.6 oz (49.3 kg)  ?02/17/21 112 lb (50.8 kg)  ?05/05/20 110 lb (49.9 kg)  ? ? ?BP 94/66   Pulse 64   Ht 5' 3"  (1.6 m)   Wt 108 lb 9.6 oz (49.3 kg)   SpO2 98%   BMI 19.24 kg/m?  ? ?Assessment and Plan: ?1. Annual physical exam ?Normal exam. ?Up to  date on screenings and immunizations. ?Tdap discussed - she will obtain if needed ?DEXA and [id number] due at age 38 ?- CBC with Differential/Platelet ?- Comprehensive metabolic panel ?- Lipid panel ?- H

## 2021-06-09 NOTE — Progress Notes (Signed)
Gastroenterology Pre-Procedure Review ? ?Request Date: 01/05/2022 ?Requesting Physician: Dr. Vicente Males ? ?PATIENT REVIEW QUESTIONS: The patient responded to the following health history questions as indicated:   ? ?1. Are you having any GI issues? no ?2. Do you have a personal history of Polyps? no ?3. Do you have a family history of Colon Cancer or Polyps? no ?4. Diabetes Mellitus? no ?5. Joint replacements in the past 12 months?no ?6. Major health problems in the past 3 months?no ?7. Any artificial heart valves, MVP, or defibrillator?no ?   ?MEDICATIONS & ALLERGIES:    ?Patient reports the following regarding taking any anticoagulation/antiplatelet therapy:   ?Plavix, Coumadin, Eliquis, Xarelto, Lovenox, Pradaxa, Brilinta, or Effient? no ?Aspirin? no ? ?Patient confirms/reports the following medications:  ?Current Outpatient Medications  ?Medication Sig Dispense Refill  ? Cholecalciferol (VITAMIN D3) 10 MCG (400 UNIT) CAPS Take 1 capsule by mouth daily.    ? fluticasone (FLONASE) 50 MCG/ACT nasal spray SPRAY 2 SPRAYS INTO EACH NOSTRIL EVERY DAY 48 mL 4  ? meloxicam (MOBIC) 7.5 MG tablet Take 7.5 mg by mouth daily as needed.    ? Multiple Vitamin (MULTIVITAMIN) tablet Take 1 tablet by mouth daily.    ? Probiotic Product (PROBIOTIC PO) Take by mouth.    ? rizatriptan (MAXALT) 10 MG tablet Take 1 tablet (10 mg total) by mouth as needed for migraine. May repeat in 2 hours if needed 9 tablet 12  ? tretinoin (RETIN-A) 0.1 % cream Apply topically at bedtime. Given by patients Dermatologist. Add IN Medication. No refill. 45 g 0  ? ?No current facility-administered medications for this visit.  ? ? ?Patient confirms/reports the following allergies:  ?Allergies  ?Allergen Reactions  ? Clindamycin/Lincomycin Hives  ? ? ?No orders of the defined types were placed in this encounter. ? ? ?AUTHORIZATION INFORMATION ?Primary Insurance: ?1D#: ?Group #: ? ?Secondary Insurance: ?1D#: ?Group #: ? ?SCHEDULE INFORMATION: ?Date:  01/05/2022 ?Time: ?[place] ? ?

## 2021-06-10 ENCOUNTER — Encounter: Payer: Self-pay | Admitting: Internal Medicine

## 2021-06-10 LAB — CBC WITH DIFFERENTIAL/PLATELET
Basophils Absolute: 0.1 10*3/uL (ref 0.0–0.2)
Basos: 1 %
EOS (ABSOLUTE): 0.1 10*3/uL (ref 0.0–0.4)
Eos: 2 %
Hematocrit: 38.6 % (ref 34.0–46.6)
Hemoglobin: 13.1 g/dL (ref 11.1–15.9)
Immature Grans (Abs): 0 10*3/uL (ref 0.0–0.1)
Immature Granulocytes: 0 %
Lymphocytes Absolute: 1.6 10*3/uL (ref 0.7–3.1)
Lymphs: 39 %
MCH: 30.8 pg (ref 26.6–33.0)
MCHC: 33.9 g/dL (ref 31.5–35.7)
MCV: 91 fL (ref 79–97)
Monocytes Absolute: 0.4 10*3/uL (ref 0.1–0.9)
Monocytes: 11 %
Neutrophils Absolute: 1.9 10*3/uL (ref 1.4–7.0)
Neutrophils: 47 %
Platelets: 256 10*3/uL (ref 150–450)
RBC: 4.25 x10E6/uL (ref 3.77–5.28)
RDW: 11.7 % (ref 11.7–15.4)
WBC: 4 10*3/uL (ref 3.4–10.8)

## 2021-06-10 LAB — TSH+FREE T4
Free T4: 1 ng/dL (ref 0.82–1.77)
TSH: 5.78 u[IU]/mL — ABNORMAL HIGH (ref 0.450–4.500)

## 2021-06-10 LAB — COMPREHENSIVE METABOLIC PANEL
ALT: 22 IU/L (ref 0–32)
AST: 26 IU/L (ref 0–40)
Albumin/Globulin Ratio: 1.8 (ref 1.2–2.2)
Albumin: 4.4 g/dL (ref 3.8–4.8)
Alkaline Phosphatase: 101 IU/L (ref 44–121)
BUN/Creatinine Ratio: 23 (ref 12–28)
BUN: 18 mg/dL (ref 8–27)
Bilirubin Total: 0.4 mg/dL (ref 0.0–1.2)
CO2: 24 mmol/L (ref 20–29)
Calcium: 9.7 mg/dL (ref 8.7–10.3)
Chloride: 102 mmol/L (ref 96–106)
Creatinine, Ser: 0.79 mg/dL (ref 0.57–1.00)
Globulin, Total: 2.4 g/dL (ref 1.5–4.5)
Glucose: 91 mg/dL (ref 70–99)
Potassium: 4.5 mmol/L (ref 3.5–5.2)
Sodium: 139 mmol/L (ref 134–144)
Total Protein: 6.8 g/dL (ref 6.0–8.5)
eGFR: 85 mL/min/{1.73_m2} (ref >59)

## 2021-06-10 LAB — LIPID PANEL
Chol/HDL Ratio: 2.3 ratio (ref 0.0–4.4)
Cholesterol, Total: 147 mg/dL (ref 100–199)
HDL: 63 mg/dL (ref >39)
LDL Chol Calc (NIH): 76 mg/dL (ref 0–99)
Triglycerides: 32 mg/dL (ref 0–149)
VLDL Cholesterol Cal: 8 mg/dL (ref 5–40)

## 2021-06-10 LAB — HEMOGLOBIN A1C
Est. average glucose Bld gHb Est-mCnc: 117 mg/dL
Hgb A1c MFr Bld: 5.7 % — ABNORMAL HIGH (ref 4.8–5.6)

## 2021-07-12 DIAGNOSIS — L578 Other skin changes due to chronic exposure to nonionizing radiation: Secondary | ICD-10-CM | POA: Diagnosis not present

## 2021-07-12 DIAGNOSIS — Z85828 Personal history of other malignant neoplasm of skin: Secondary | ICD-10-CM | POA: Diagnosis not present

## 2021-07-12 DIAGNOSIS — Z85820 Personal history of malignant melanoma of skin: Secondary | ICD-10-CM | POA: Diagnosis not present

## 2021-07-12 DIAGNOSIS — Z08 Encounter for follow-up examination after completed treatment for malignant neoplasm: Secondary | ICD-10-CM | POA: Diagnosis not present

## 2021-07-12 DIAGNOSIS — D485 Neoplasm of uncertain behavior of skin: Secondary | ICD-10-CM | POA: Diagnosis not present

## 2021-07-12 DIAGNOSIS — L570 Actinic keratosis: Secondary | ICD-10-CM | POA: Diagnosis not present

## 2021-11-30 ENCOUNTER — Encounter: Payer: Self-pay | Admitting: Internal Medicine

## 2021-12-01 ENCOUNTER — Ambulatory Visit
Admission: RE | Admit: 2021-12-01 | Discharge: 2021-12-01 | Disposition: A | Discharge status: Home / Self Care | Payer: BC Managed Care – PPO | Source: Ambulatory Visit | Attending: Internal Medicine | Admitting: Internal Medicine

## 2021-12-01 DIAGNOSIS — Z1231 Encounter for screening mammogram for malignant neoplasm of breast: Secondary | ICD-10-CM | POA: Insufficient documentation

## 2021-12-29 ENCOUNTER — Telehealth: Payer: Self-pay | Admitting: Gastroenterology

## 2021-12-29 NOTE — Telephone Encounter (Signed)
Pt left message in ref to procedure coming up on 01/01/2022 she has a couple of questions

## 2021-12-30 NOTE — Telephone Encounter (Signed)
Called patient back and she wanted to know what she needed to do if she was exposed to [place] since she had been in [place] and returns home this Sunday 01/02/2022. I gave her the current guidelines. Patient then had no further questions.

## 2022-01-04 ENCOUNTER — Encounter: Payer: Self-pay | Admitting: Gastroenterology

## 2022-01-05 ENCOUNTER — Ambulatory Visit: Payer: BC Managed Care – PPO | Admitting: Anesthesiology

## 2022-01-05 ENCOUNTER — Encounter
Admission: RE | Disposition: A | Discharge status: Home / Self Care | Payer: Self-pay | Source: Home / Self Care | Attending: Gastroenterology

## 2022-01-05 ENCOUNTER — Encounter: Payer: Self-pay | Admitting: Gastroenterology

## 2022-01-05 ENCOUNTER — Ambulatory Visit
Admission: RE | Admit: 2022-01-05 | Discharge: 2022-01-05 | Disposition: A | Discharge status: Home / Self Care | Payer: BC Managed Care – PPO | Attending: Gastroenterology | Admitting: Gastroenterology

## 2022-01-05 DIAGNOSIS — Z85828 Personal history of other malignant neoplasm of skin: Secondary | ICD-10-CM | POA: Diagnosis not present

## 2022-01-05 DIAGNOSIS — Z1211 Encounter for screening for malignant neoplasm of colon: Secondary | ICD-10-CM | POA: Diagnosis not present

## 2022-01-05 DIAGNOSIS — T7840XA Allergy, unspecified, initial encounter: Secondary | ICD-10-CM | POA: Diagnosis not present

## 2022-01-05 DIAGNOSIS — J302 Other seasonal allergic rhinitis: Secondary | ICD-10-CM | POA: Insufficient documentation

## 2022-01-05 DIAGNOSIS — K640 First degree hemorrhoids: Secondary | ICD-10-CM | POA: Diagnosis not present

## 2022-01-05 DIAGNOSIS — K648 Other hemorrhoids: Secondary | ICD-10-CM | POA: Diagnosis not present

## 2022-01-05 DIAGNOSIS — G43909 Migraine, unspecified, not intractable, without status migrainosus: Secondary | ICD-10-CM | POA: Insufficient documentation

## 2022-01-05 HISTORY — PX: COLONOSCOPY WITH PROPOFOL: SHX5780

## 2022-01-05 SURGERY — COLONOSCOPY WITH PROPOFOL
Anesthesia: General

## 2022-01-05 MED ORDER — PHENYLEPHRINE 80 MCG/ML (10ML) SYRINGE FOR IV PUSH (FOR BLOOD PRESSURE SUPPORT)
PREFILLED_SYRINGE | INTRAVENOUS | Status: AC
  Filled 2022-01-05: qty 10

## 2022-01-05 MED ORDER — PROPOFOL 10 MG/ML IV BOLUS
INTRAVENOUS | Status: DC | PRN
  Administered 2022-01-05: 40 mg via INTRAVENOUS
  Administered 2022-01-05: 50 mg via INTRAVENOUS
  Administered 2022-01-05 (×3): 40 mg via INTRAVENOUS
  Administered 2022-01-05: 90 mg via INTRAVENOUS

## 2022-01-05 MED ORDER — PHENYLEPHRINE 80 MCG/ML (10ML) SYRINGE FOR IV PUSH (FOR BLOOD PRESSURE SUPPORT)
PREFILLED_SYRINGE | INTRAVENOUS | Status: DC | PRN
  Administered 2022-01-05: 80 ug via INTRAVENOUS

## 2022-01-05 MED ORDER — PROPOFOL 10 MG/ML IV BOLUS
INTRAVENOUS | Status: AC
  Filled 2022-01-05: qty 40

## 2022-01-05 MED ORDER — STERILE WATER FOR IRRIGATION IR SOLN
Status: DC | PRN
  Administered 2022-01-05: 50 mL

## 2022-01-05 MED ORDER — LIDOCAINE HCL (PF) 2 % IJ SOLN
INTRAMUSCULAR | Status: AC
  Filled 2022-01-05: qty 15

## 2022-01-05 MED ORDER — PROPOFOL 1000 MG/100ML IV EMUL
INTRAVENOUS | Status: AC
  Filled 2022-01-05: qty 200

## 2022-01-05 MED ORDER — SODIUM CHLORIDE 0.9 % IV SOLN
INTRAVENOUS | Status: DC
  Administered 2022-01-05: 1000 mL via INTRAVENOUS

## 2022-01-05 NOTE — Anesthesia Postprocedure Evaluation (Signed)
Anesthesia Post Note  Patient: Kristi Murphy  Procedure(s) Performed: COLONOSCOPY WITH PROPOFOL  Patient location during evaluation: Endoscopy Anesthesia Type: General Level of consciousness: awake and alert Pain management: pain level controlled Vital Signs Assessment: post-procedure vital signs reviewed and stable Respiratory status: spontaneous breathing, nonlabored ventilation, respiratory function stable and patient connected to nasal cannula oxygen Cardiovascular status: blood pressure returned to baseline and stable Postop Assessment: no apparent nausea or vomiting Anesthetic complications: no   No notable events documented.   Last Vitals:  Vitals:   01/05/22 0835 01/05/22 0901  BP: (!) 89/50 (!) 92/47  Pulse: 66 69  Resp: 16 (!) 21  Temp: (!) 36.2 C (!) 35.9 C  SpO2: 100% 99%    Last Pain:  Vitals:   01/05/22 0901  TempSrc: Temporal  PainSc: Asleep                 Ilene Qua

## 2022-01-05 NOTE — Anesthesia Preprocedure Evaluation (Signed)
Anesthesia Evaluation  Patient identified by MRN, date of birth, ID band Patient awake    Reviewed: Allergy & Precautions, NPO status , Patient's Chart, lab work & pertinent test results  History of Anesthesia Complications Negative for: history of anesthetic complications  Airway Mallampati: II  TM Distance: >3 FB Neck ROM: full    Dental  (+) Teeth Intact   Pulmonary neg pulmonary ROS   Pulmonary exam normal        Cardiovascular negative cardio ROS Normal cardiovascular exam     Neuro/Psych  Headaches  negative psych ROS   GI/Hepatic negative GI ROS, Neg liver ROS,,,  Endo/Other  negative endocrine ROS    Renal/GU negative Renal ROS  negative genitourinary   Musculoskeletal   Abdominal   Peds  Hematology negative hematology ROS (+)   Anesthesia Other Findings Past Medical History: No date: Allergy     Comment:  seasonal No date: Migraines No date: Skin cancer  Past Surgical History: No date: ANKLE SURGERY     Comment:  fracture 01/15/2012: COLONOSCOPY     Comment:  normal - 10 yr follow up 2011: melanoma; Right     Comment:  removal-  01/2016: MENISCUS REPAIR; Left     Reproductive/Obstetrics negative OB ROS                             Anesthesia Physical Anesthesia Plan  ASA: 2  Anesthesia Plan: General   Post-op Pain Management: Minimal or no pain anticipated   Induction: Intravenous  PONV Risk Score and Plan: Propofol infusion and TIVA  Airway Management Planned: Natural Airway and Nasal Cannula  Additional Equipment:   Intra-op Plan:   Post-operative Plan:   Informed Consent: I have reviewed the patients History and Physical, chart, labs and discussed the procedure including the risks, benefits and alternatives for the proposed anesthesia with the patient or authorized representative who has indicated his/her understanding and acceptance.     Dental  Advisory Given  Plan Discussed with: Anesthesiologist, CRNA and Surgeon  Anesthesia Plan Comments: (Patient consented for risks of anesthesia including but not limited to:  - adverse reactions to medications - risk of airway placement if required - damage to eyes, teeth, lips or other oral mucosa - nerve damage due to positioning  - sore throat or hoarseness - Damage to heart, brain, nerves, lungs, other parts of body or loss of life  Patient voiced understanding.)       Anesthesia Quick Evaluation

## 2022-01-05 NOTE — Op Note (Signed)
[facility] Patient Name: Kristi Murphy Procedure Date: 01/05/2022 7:26 AM MRN: [id number] Account #: [id number] Date of Birth: 1960/12/15 Admit Type: Outpatient Age: 61 Room: [facility] ENDO ROOM 3 Gender: Female Note Status: Finalized Instrument Name: Jasper Riling [id number] Procedure:             Colonoscopy Indications:           Screening for colorectal malignant neoplasm Providers:             Jonathon Bellows MD, MD Referring MD:          Halina Maidens, MD (Referring MD) Medicines:             Monitored Anesthesia Care Complications:         No immediate complications. Procedure:             Pre-Anesthesia Assessment:                        - Prior to the procedure, a History and Physical was                         performed, and patient medications, allergies and                         sensitivities were reviewed. The patient's tolerance                         of previous anesthesia was reviewed.                        - The risks and benefits of the procedure and the                         sedation options and risks were discussed with the                         patient. All questions were answered and informed                         consent was obtained.                        - ASA Grade Assessment: II - A patient with mild                         systemic disease.                        After obtaining informed consent, the colonoscope was                         passed under direct vision. Throughout the procedure,                         the patient's blood pressure, pulse, and oxygen                         saturations were monitored continuously. The                         Colonoscope was introduced  through the anus and                         advanced to the the cecum, identified by the                         appendiceal orifice. The colonoscopy was performed                         with ease. The patient tolerated the procedure well.                          The quality of the bowel preparation was excellent.                         The ileocecal valve, appendiceal orifice, and rectum                         were photographed. Findings:      The perianal and digital rectal examinations were normal.      The entire examined colon appeared normal on direct and retroflexion       views.      Non-bleeding internal hemorrhoids were found during retroflexion. The       hemorrhoids were medium-sized and Grade I (internal hemorrhoids that do       not prolapse). Impression:            - The entire examined colon is normal on direct and                         retroflexion views.                        - Non-bleeding internal hemorrhoids.                        - No specimens collected. Recommendation:        - Repeat colonoscopy in 10 years for screening                         purposes. Procedure Code(s):     --- Professional ---                        [phone], Colonoscopy, flexible; diagnostic, including                         collection of specimen(s) by brushing or washing, when                         performed (separate procedure) Diagnosis Code(s):     --- Professional ---                        Z12.11, Encounter for screening for malignant neoplasm                         of colon                        K64.0, First degree hemorrhoids CPT copyright 2022 American Medical Association. All rights  reserved. The codes documented in this report are preliminary and upon coder review may  be revised to meet current compliance requirements. Jonathon Bellows, MD Jonathon Bellows MD, MD 01/05/2022 9:00:57 AM This report has been signed electronically. Number of Addenda: 0 Note Initiated On: 01/05/2022 7:26 AM Scope Withdrawal Time: 0 hours 8 minutes 38 seconds  Total Procedure Duration: 0 hours 15 minutes 39 seconds  Estimated Blood Loss:  Estimated blood loss: none.      [facility]

## 2022-01-05 NOTE — H&P (Signed)
Kristi Bellows, MD [address], [place], [place], [place], [address] [address], [place], [place], [place], [address] Phone: [phone]  Fax: [phone]  Primary Care Physician:  Glean Hess, MD   Pre-Procedure History & Physical: HPI:  Kristi Murphy is a 61 y.o. female is here for an colonoscopy.   Past Medical History:  Diagnosis Date   Allergy    seasonal   Migraines    Skin cancer     Past Surgical History:  Procedure Laterality Date   ANKLE SURGERY     fracture   COLONOSCOPY  01/15/2012   normal - 10 yr follow up   melanoma Right 2011   removal-    MENISCUS REPAIR Left 01/2016    Prior to Admission medications   Medication Sig Start Date End Date Taking? Authorizing Provider  Cholecalciferol (VITAMIN D3) 10 MCG (400 UNIT) CAPS Take 1 capsule by mouth daily. 03/04/19  Yes [provider]  fluticasone (FLONASE) 50 MCG/ACT nasal spray SPRAY 2 SPRAYS INTO EACH NOSTRIL EVERY DAY 06/30/20  Yes Glean Hess, MD  Multiple Vitamin (MULTIVITAMIN) tablet Take 1 tablet by mouth daily.   Yes [provider]  Probiotic Product (PROBIOTIC PO) Take by mouth.   Yes [provider]  meloxicam (MOBIC) 7.5 MG tablet Take 7.5 mg by mouth daily as needed. 02/16/21   [provider]  rizatriptan (MAXALT) 10 MG tablet Take 1 tablet (10 mg total) by mouth as needed for migraine. May repeat in 2 hours if needed Patient taking differently: Take by mouth as needed for migraine. May repeat in 2 hours if needed 06/09/21   Glean Hess, MD  tretinoin (RETIN-A) 0.1 % cream Apply topically at bedtime. Given by patients Dermatologist. Add IN Medication. No refill. 02/23/21   Glean Hess, MD    Allergies as of 06/09/2021 - Review Complete 06/09/2021  Allergen Reaction Noted   Clindamycin/lincomycin Hives 10/04/2016    Family History  Problem Relation Age of Onset   Skin cancer Mother    Diabetes Mother    Leukemia Father    Skin  cancer Father    Heart disease Father    Diabetes Sister    Breast cancer Neg Hx     Social History   Socioeconomic History   Marital status: Married    Spouse name: Not on file   Number of children: Not on file   Years of education: Not on file   Highest education level: Not on file  Occupational History   Occupation: retired  Tobacco Use   Smoking status: Never   Smokeless tobacco: Never  Vaping Use   Vaping Use: Never used  Substance and Sexual Activity   Alcohol use: No   Drug use: No   Sexual activity: Not on file  Other Topics Concern   Not on file  Social History Narrative   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 General:   Alert,  pleasant and cooperative in NAD Head:  Normocephalic and atraumatic. Neck:  Supple; no masses or  thyromegaly. Lungs:  Clear throughout to auscultation, normal respiratory effort.    Heart:  +S1, +S2, Regular rate and rhythm, No edema. Abdomen:  Soft, nontender and nondistended. Normal bowel sounds, without guarding, and without rebound.   Neurologic:  Alert and  oriented x4;  grossly normal neurologically.  Impression/Plan: Kristi Murphy is here for an colonoscopy to be performed for Screening colonoscopy average risk   Risks, benefits, limitations, and alternatives regarding  colonoscopy have been reviewed with the patient.  Questions have been answered.  All parties agreeable.   Kristi Bellows, MD  01/05/2022, 8:30 AM

## 2022-01-05 NOTE — Transfer of Care (Signed)
Immediate Anesthesia Transfer of Care Note  Patient: Kristi Murphy  Procedure(s) Performed: COLONOSCOPY WITH PROPOFOL  Patient Location: Endoscopy Unit  Anesthesia Type:General  Level of Consciousness: drowsy  Airway & Oxygen Therapy: Patient Spontanous Breathing and Patient connected to nasal cannula oxygen  Post-op Assessment: Report given to RN, Post -op Vital signs reviewed and stable, and Patient moving all extremities  Post vital signs: Reviewed and stable  Last Vitals:  Vitals Value Taken Time  BP 92/47 01/05/22 0901  Temp 35.9 C 01/05/22 0901  Pulse 69 01/05/22 0901  Resp 21 01/05/22 0901  SpO2 99 % 01/05/22 0901    Last Pain:  Vitals:   01/05/22 0901  TempSrc: Temporal  PainSc: Asleep         Complications: No notable events documented.

## 2022-01-06 ENCOUNTER — Encounter: Payer: Self-pay | Admitting: Gastroenterology

## 2022-01-10 DIAGNOSIS — L578 Other skin changes due to chronic exposure to nonionizing radiation: Secondary | ICD-10-CM | POA: Diagnosis not present

## 2022-01-10 DIAGNOSIS — L570 Actinic keratosis: Secondary | ICD-10-CM | POA: Diagnosis not present

## 2022-01-10 DIAGNOSIS — Z85820 Personal history of malignant melanoma of skin: Secondary | ICD-10-CM | POA: Diagnosis not present

## 2022-01-10 DIAGNOSIS — Z85828 Personal history of other malignant neoplasm of skin: Secondary | ICD-10-CM | POA: Diagnosis not present

## 2022-01-10 DIAGNOSIS — Z08 Encounter for follow-up examination after completed treatment for malignant neoplasm: Secondary | ICD-10-CM | POA: Diagnosis not present

## 2022-02-11 ENCOUNTER — Encounter: Payer: Self-pay | Admitting: Internal Medicine

## 2022-06-12 ENCOUNTER — Other Ambulatory Visit: Payer: Self-pay | Admitting: Internal Medicine

## 2022-06-12 DIAGNOSIS — G43C0 Periodic headache syndromes in child or adult, not intractable: Secondary | ICD-10-CM

## 2022-06-15 ENCOUNTER — Encounter: Payer: Self-pay | Admitting: Internal Medicine

## 2022-06-15 ENCOUNTER — Ambulatory Visit (INDEPENDENT_AMBULATORY_CARE_PROVIDER_SITE_OTHER): Payer: Managed Care, Other (non HMO) | Admitting: Internal Medicine

## 2022-06-15 VITALS — BP 100/58 | HR 63 | Ht 63.0 in | Wt 104.3 lb

## 2022-06-15 DIAGNOSIS — R7303 Prediabetes: Secondary | ICD-10-CM | POA: Diagnosis not present

## 2022-06-15 DIAGNOSIS — Z1231 Encounter for screening mammogram for malignant neoplasm of breast: Secondary | ICD-10-CM

## 2022-06-15 DIAGNOSIS — Z0000 Encounter for general adult medical examination without abnormal findings: Secondary | ICD-10-CM | POA: Diagnosis not present

## 2022-06-15 DIAGNOSIS — Z85820 Personal history of malignant melanoma of skin: Secondary | ICD-10-CM | POA: Diagnosis not present

## 2022-06-15 DIAGNOSIS — G43009 Migraine without aura, not intractable, without status migrainosus: Secondary | ICD-10-CM | POA: Diagnosis not present

## 2022-06-15 DIAGNOSIS — R7989 Other specified abnormal findings of blood chemistry: Secondary | ICD-10-CM

## 2022-06-15 DIAGNOSIS — J3089 Other allergic rhinitis: Secondary | ICD-10-CM

## 2022-06-15 MED ORDER — FLUTICASONE PROPIONATE 50 MCG/ACT NA SUSP: NASAL | 4 refills | Status: AC

## 2022-06-15 NOTE — Progress Notes (Signed)
Date:  06/15/2022   Name:  Kristi Murphy   DOB:  1960/11/28   MRN:  [id number]   Chief Complaint: Annual Exam [name] is a 62 y.o. female who presents today for her Complete Annual Exam. She feels well. She reports exercising. She reports she is sleeping poorly. Breast complaints - none.  Mammogram: 12/2021 DEXA: none Pap smear: 04/2018 neg/neg Colonoscopy: 12/2021 repeat 10 yrs  Health Maintenance Due  Topic Date Due   HIV Screening  Never done   DTaP/Tdap/Td (1 - Tdap) Never done   COVID-19 Vaccine (7 - 2023-24 season) 02/01/2022    Immunization History  Administered Date(s) Administered   Influenza Inj Mdck Quad Pf 11/09/2018   Influenza,inj,Quad PF,6+ Mos 11/27/2019, 11/09/2020   Influenza-Unspecified 12/01/2021   Moderna Sars-Covid-2 Vaccination 05/08/2019, 05/29/2019, 12/23/2019   PFIZER Comirnaty(Gray Top)Covid-19 Tri-Sucrose Vaccine 06/08/2020   Pfizer Covid-19 Vaccine Bivalent Booster 75yrs & up 01/11/2021, 12/07/2021   Zoster Recombinat (Shingrix) 10/22/2019, 02/19/2020    Migraine  This is a recurrent problem. The problem occurs intermittently. The problem has been unchanged. The pain quality is similar to prior headaches. Pertinent negatives include no abdominal pain, coughing, dizziness, fever, hearing loss, tinnitus or vomiting. She has tried triptans for the symptoms.    Lab Results  Component Value Date   NA 139 06/09/2021   K 4.5 06/09/2021   CO2 24 06/09/2021   GLUCOSE 91 06/09/2021   BUN 18 06/09/2021   CREATININE 0.79 06/09/2021   CALCIUM 9.7 06/09/2021   EGFR 85 06/09/2021   GFRNONAA 80 04/30/2019   Lab Results  Component Value Date   CHOL 147 06/09/2021   HDL 63 06/09/2021   LDLCALC 76 06/09/2021   TRIG 32 06/09/2021   CHOLHDL 2.3 06/09/2021   Lab Results  Component Value Date   TSH 5.780 (H) 06/09/2021   Lab Results  Component Value Date   HGBA1C 5.7 (H) 06/09/2021   Lab Results  Component Value Date   WBC 4.0 06/09/2021    HGB 13.1 06/09/2021   HCT 38.6 06/09/2021   MCV 91 06/09/2021   PLT 256 06/09/2021   Lab Results  Component Value Date   ALT 22 06/09/2021   AST 26 06/09/2021   ALKPHOS 101 06/09/2021   BILITOT 0.4 06/09/2021   No results found for: "25OHVITD2", "25OHVITD3", "VD25OH"   Review of Systems  Constitutional:  Negative for chills, fatigue and fever.  HENT:  Negative for congestion, hearing loss, tinnitus, trouble swallowing and voice change.   Eyes:  Negative for visual disturbance.  Respiratory:  Negative for cough, chest tightness, shortness of breath and wheezing.   Cardiovascular:  Negative for chest pain, palpitations and leg swelling.  Gastrointestinal:  Negative for abdominal pain, constipation, diarrhea and vomiting.  Endocrine: Negative for polydipsia and polyuria.  Genitourinary:  Negative for dysuria, frequency, genital sores, vaginal bleeding and vaginal discharge.  Musculoskeletal:  Negative for arthralgias, gait problem and joint swelling.  Skin:  Negative for color change and rash.  Neurological:  Negative for dizziness, tremors, light-headedness and headaches.  Hematological:  Negative for adenopathy. Does not bruise/bleed easily.  Psychiatric/Behavioral:  Negative for dysphoric mood and sleep disturbance. The patient is not nervous/anxious.     Patient Active Problem List   Diagnosis Date Noted   Acute torn meniscus of knee, right, initial encounter 01/19/2021   Trigger middle finger of right hand 04/17/2018   Ganglion of right ankle 04/06/2017   Prediabetes 10/04/2016   Hx of melanoma of skin  10/04/2016   Migraine without aura and responsive to treatment 10/04/2016   Environmental and seasonal allergies 10/04/2016   Primary osteoarthritis of left knee 10/04/2016    Allergies  Allergen Reactions   Clindamycin/Lincomycin Hives    Past Surgical History:  Procedure Laterality Date   ANKLE SURGERY     fracture   COLONOSCOPY  01/15/2012   normal - 10 yr  follow up   COLONOSCOPY WITH PROPOFOL N/A 01/05/2022   Procedure: COLONOSCOPY WITH PROPOFOL;  Surgeon: Wyline Mood, MD;  Location: [facility] ENDOSCOPY;  Service: Gastroenterology;  Laterality: N/A;   melanoma Right 2011   removal-    MENISCUS REPAIR Left 01/2016    Social History   Tobacco Use   Smoking status: Never   Smokeless tobacco: Never  Vaping Use   Vaping Use: Never used  Substance Use Topics   Alcohol use: No   Drug use: No     Medication list has been reviewed and updated.  Current Meds  Medication Sig   Cholecalciferol (VITAMIN D3) 10 MCG (400 UNIT) CAPS Take 1 capsule by mouth daily.   Krill Oil 300 MG CAPS Take by mouth.   meloxicam (MOBIC) 7.5 MG tablet Take 7.5 mg by mouth daily as needed.   Multiple Vitamin (MULTIVITAMIN) tablet Take 1 tablet by mouth daily.   Probiotic Product (PROBIOTIC PO) Take by mouth.   rizatriptan (MAXALT) 10 MG tablet TAKE 1 TABLET BY MOUTH AS NEEDED FOR MIGRAINE. MAY REPEAT IN 2 HOURS IF NEEDED   tretinoin (RETIN-A) 0.1 % cream Apply topically at bedtime. Given by patients Dermatologist. Add IN Medication. No refill.   [DISCONTINUED] fluticasone (FLONASE) 50 MCG/ACT nasal spray SPRAY 2 SPRAYS INTO EACH NOSTRIL EVERY DAY       06/15/2022    8:05 AM 06/09/2021    8:25 AM 02/17/2021    1:57 PM 05/05/2020    8:34 AM  GAD 7 : Generalized Anxiety Score  Nervous, Anxious, on Edge 0 0 0 0  Control/stop worrying 0 0 0 0  Worry too much - different things 0 0 0 0  Trouble relaxing 0 0 0 0  Restless 0 0 0 0  Easily annoyed or irritable 0 0 0 0  Afraid - awful might happen 0 0 0 0  Total GAD 7 Score 0 0 0 0  Anxiety Difficulty Not difficult at all Not difficult at all Not difficult at all        06/15/2022    8:05 AM 06/09/2021    8:25 AM 02/17/2021    1:57 PM  Depression screen PHQ 2/9  Decreased Interest 0 0 0  Down, Depressed, Hopeless 0 0 0  PHQ - 2 Score 0 0 0  Altered sleeping 0 0 1  Tired, decreased energy 0 0 0  Change in  appetite 0 0 0  Feeling bad or failure about yourself  0 0 0  Trouble concentrating 0 0 0  Moving slowly or fidgety/restless 0 0 0  Suicidal thoughts 0 0 0  PHQ-9 Score 0 0 1  Difficult doing work/chores Not difficult at all Not difficult at all Not difficult at all    BP Readings from Last 3 Encounters:  06/15/22 (!) 100/58  01/05/22 91/71  06/09/21 94/66    Physical Exam Vitals and nursing note reviewed.  Constitutional:      General: She is not in acute distress.    Appearance: She is well-developed.  HENT:     Head: Normocephalic and atraumatic.  Right Ear: Tympanic membrane and ear canal normal.     Left Ear: Tympanic membrane and ear canal normal.     Nose:     Right Sinus: No maxillary sinus tenderness.     Left Sinus: No maxillary sinus tenderness.  Eyes:     General: No scleral icterus.       Right eye: No discharge.        Left eye: No discharge.     Conjunctiva/sclera: Conjunctivae normal.  Neck:     Thyroid: No thyromegaly.     Vascular: No carotid bruit.  Cardiovascular:     Rate and Rhythm: Normal rate and regular rhythm.     Pulses: Normal pulses.     Heart sounds: Normal heart sounds.  Pulmonary:     Effort: Pulmonary effort is normal. No respiratory distress.     Breath sounds: No wheezing.  Chest:  Breasts:    Right: No mass, nipple discharge, skin change or tenderness.     Left: No mass, nipple discharge, skin change or tenderness.  Abdominal:     General: Bowel sounds are normal.     Palpations: Abdomen is soft.     Tenderness: There is no abdominal tenderness.  Musculoskeletal:     Cervical back: Normal range of motion. No erythema.     Right lower leg: No edema.     Left lower leg: No edema.  Lymphadenopathy:     Cervical: No cervical adenopathy.  Skin:    General: Skin is warm and dry.     Findings: No rash.     Comments: Fungal changes left great toenail - resolving with topical treatment  Neurological:     Mental Status: She is  alert and oriented to person, place, and time.     Cranial Nerves: No cranial nerve deficit.     Sensory: No sensory deficit.     Deep Tendon Reflexes: Reflexes are normal and symmetric.  Psychiatric:        Attention and Perception: Attention normal.        Mood and Affect: Mood normal.     Wt Readings from Last 3 Encounters:  06/15/22 104 lb 4.8 oz (47.3 kg)  01/05/22 102 lb 3.3 oz (46.4 kg)  06/09/21 108 lb 9.6 oz (49.3 kg)    BP (!) 100/58   Pulse 63   Ht 5\' 3"  (1.6 m)   Wt 104 lb 4.8 oz (47.3 kg)   SpO2 98%   BMI 18.48 kg/m   Assessment and Plan:  Problem List Items Addressed This Visit     Hx of melanoma of skin    Continue biannual skin exams with Dermatology      Environmental and seasonal allergies (Chronic)   Relevant Medications   fluticasone (FLONASE) 50 MCG/ACT nasal spray   Migraine without aura and responsive to treatment (Chronic)    No recent change in the character or frequency of headaches. Headaches respond well to current therapy with Maxalt but has to take at least 2 doses Will try Qulipta      Prediabetes (Chronic)    Managed with diet. Lab Results  Component Value Date   HGBA1C 5.7 (H) 06/09/2021        Relevant Orders   Comprehensive metabolic panel   Hemoglobin A1c   Other Visit Diagnoses     Annual physical exam    -  Primary   Relevant Orders   CBC with Differential/Platelet   Comprehensive metabolic panel  Hemoglobin A1c   Lipid panel   TSH + free T4   Encounter for screening mammogram for breast cancer       Relevant Orders   MM 3D SCREENING MAMMOGRAM BILATERAL BREAST   Elevated TSH       Relevant Orders   TSH + free T4       No follow-ups on file.   Partially dictated using Dragon software, any errors are not intentional.  Reubin Milan, MD [facility] [place], [place]

## 2022-06-15 NOTE — Assessment & Plan Note (Signed)
Managed with diet. Lab Results  Component Value Date   HGBA1C 5.7 (H) 06/09/2021

## 2022-06-15 NOTE — Patient Instructions (Signed)
Call [facility] to schedule your mammogram at [phone].Marland Kitchen  Begin Qulipta 60 mg once a day.  You may still take Maxalt if needed.  -It was a pleasure to see you today! Please review your visit summary for helpful information. -Lab results are usually available within 1-2 days and we will call once reviewed. -I would encourage you to follow your care via MyChart where you can access lab results, notes, messages, and more. -If you feel that we did a nice job today, please complete your after-visit survey and leave [place] a Google review! Your CMA today was Chassidy and your provider was Dr Bari Edward, MD.

## 2022-06-15 NOTE — Assessment & Plan Note (Signed)
Continue biannual skin exams with Dermatology

## 2022-06-15 NOTE — Assessment & Plan Note (Addendum)
No recent change in the character or frequency of headaches. Headaches respond well to current therapy with Maxalt but has to take at least 2 doses Will try [place]

## 2022-06-16 LAB — HEMOGLOBIN A1C
Est. average glucose Bld gHb Est-mCnc: 114 mg/dL
Hgb A1c MFr Bld: 5.6 % (ref 4.8–5.6)

## 2022-06-16 LAB — CBC WITH DIFFERENTIAL/PLATELET
Basophils Absolute: 0.1 10*3/uL (ref 0.0–0.2)
Basos: 1 %
EOS (ABSOLUTE): 0.1 10*3/uL (ref 0.0–0.4)
Eos: 3 %
Hematocrit: 37.9 % (ref 34.0–46.6)
Hemoglobin: 12.7 g/dL (ref 11.1–15.9)
Immature Grans (Abs): 0 10*3/uL (ref 0.0–0.1)
Immature Granulocytes: 0 %
Lymphocytes Absolute: 1.4 10*3/uL (ref 0.7–3.1)
Lymphs: 34 %
MCH: 31.1 pg (ref 26.6–33.0)
MCHC: 33.5 g/dL (ref 31.5–35.7)
MCV: 93 fL (ref 79–97)
Monocytes Absolute: 0.5 10*3/uL (ref 0.1–0.9)
Monocytes: 11 %
Neutrophils Absolute: 2.1 10*3/uL (ref 1.4–7.0)
Neutrophils: 51 %
Platelets: 259 10*3/uL (ref 150–450)
RBC: 4.08 x10E6/uL (ref 3.77–5.28)
RDW: 11.4 % — ABNORMAL LOW (ref 11.7–15.4)
WBC: 4.1 10*3/uL (ref 3.4–10.8)

## 2022-06-16 LAB — COMPREHENSIVE METABOLIC PANEL
ALT: 18 IU/L (ref 0–32)
AST: 22 IU/L (ref 0–40)
Albumin/Globulin Ratio: 1.6 (ref 1.2–2.2)
Albumin: 4 g/dL (ref 3.9–4.9)
Alkaline Phosphatase: 97 IU/L (ref 44–121)
BUN/Creatinine Ratio: 24 (ref 12–28)
BUN: 21 mg/dL (ref 8–27)
Bilirubin Total: 0.4 mg/dL (ref 0.0–1.2)
CO2: 23 mmol/L (ref 20–29)
Calcium: 9.3 mg/dL (ref 8.7–10.3)
Chloride: 104 mmol/L (ref 96–106)
Creatinine, Ser: 0.89 mg/dL (ref 0.57–1.00)
Globulin, Total: 2.5 g/dL (ref 1.5–4.5)
Glucose: 89 mg/dL (ref 70–99)
Potassium: 4.1 mmol/L (ref 3.5–5.2)
Sodium: 140 mmol/L (ref 134–144)
Total Protein: 6.5 g/dL (ref 6.0–8.5)
eGFR: 73 mL/min/{1.73_m2} (ref >59)

## 2022-06-16 LAB — TSH+FREE T4
Free T4: 1.02 ng/dL (ref 0.82–1.77)
TSH: 4.61 u[IU]/mL — ABNORMAL HIGH (ref 0.450–4.500)

## 2022-06-16 LAB — LIPID PANEL
Chol/HDL Ratio: 2.5 ratio (ref 0.0–4.4)
Cholesterol, Total: 159 mg/dL (ref 100–199)
HDL: 64 mg/dL (ref >39)
LDL Chol Calc (NIH): 86 mg/dL (ref 0–99)
Triglycerides: 43 mg/dL (ref 0–149)
VLDL Cholesterol Cal: 9 mg/dL (ref 5–40)

## 2022-07-14 ENCOUNTER — Encounter: Payer: Self-pay | Admitting: Internal Medicine

## 2022-07-25 ENCOUNTER — Other Ambulatory Visit: Payer: Self-pay | Admitting: Internal Medicine

## 2022-07-25 ENCOUNTER — Telehealth: Payer: Self-pay

## 2022-07-25 ENCOUNTER — Encounter: Payer: Self-pay | Admitting: Internal Medicine

## 2022-07-25 DIAGNOSIS — G43009 Migraine without aura, not intractable, without status migrainosus: Secondary | ICD-10-CM

## 2022-07-25 MED ORDER — NURTEC 75 MG PO TBDP: 75.0000 mg | ORAL_TABLET | Freq: Every day | ORAL | 0 refills | Status: DC | PRN

## 2022-07-25 NOTE — Telephone Encounter (Signed)
Please review.  KP

## 2022-07-25 NOTE — Telephone Encounter (Signed)
PA Case ID #: [id number] Rx #: [id number] Need Help? Call us at [phone]  Outcome Approved today  CaseId:[id number]  Status:Approved Date:07/25/2022;Coverage End Date:07/25/2023;  Drug Nurtec 75MG  dispersible tablets  Form Passenger transport manager PA Form [phone] NCPDP)  Patient informed of approval.

## 2022-07-25 NOTE — Telephone Encounter (Signed)
Pt response.  KP

## 2022-11-16 ENCOUNTER — Encounter: Payer: Self-pay | Admitting: Internal Medicine

## 2022-12-05 ENCOUNTER — Ambulatory Visit
Admission: RE | Admit: 2022-12-05 | Discharge: 2022-12-05 | Disposition: A | Discharge status: Home / Self Care | Payer: Managed Care, Other (non HMO) | Source: Ambulatory Visit | Attending: Internal Medicine | Admitting: Internal Medicine

## 2022-12-05 DIAGNOSIS — Z1231 Encounter for screening mammogram for malignant neoplasm of breast: Secondary | ICD-10-CM | POA: Diagnosis present

## 2022-12-08 ENCOUNTER — Other Ambulatory Visit: Payer: Self-pay | Admitting: Internal Medicine

## 2022-12-08 DIAGNOSIS — R928 Other abnormal and inconclusive findings on diagnostic imaging of breast: Secondary | ICD-10-CM

## 2022-12-09 ENCOUNTER — Telehealth: Payer: Self-pay | Admitting: Internal Medicine

## 2022-12-09 NOTE — Telephone Encounter (Signed)
Copied from CRM [id number]. Topic: General - Other >> Dec 09, 2022  9:25 AM Franchot Heidelberg wrote: Reason for CRM: Pt called requesting to speak to someone in the clinic about "a test result" please advise. Did not disclose any further info when asked.   Best contact: [phone]

## 2022-12-09 NOTE — Telephone Encounter (Signed)
Called and spoke with patient. We discussed her mammogram results. Informed her they found asymmetry of right breast and will do further imaging. Gave her CPT code for Diagnostic mammogram of one breast so she can call insurance to make sure its covered.  - Kristi Murphy

## 2022-12-13 ENCOUNTER — Encounter: Payer: Self-pay | Admitting: Internal Medicine

## 2022-12-13 ENCOUNTER — Telehealth: Payer: Self-pay | Admitting: Internal Medicine

## 2022-12-13 NOTE — Telephone Encounter (Signed)
Patient called sttd she needs the provider or nurse to call her about the diagnostic mammogram schedule for tomorrow

## 2022-12-13 NOTE — Telephone Encounter (Signed)
Left VM for patient to call back with more information.  - Kristi Murphy

## 2022-12-14 ENCOUNTER — Ambulatory Visit: Admission: RE | Admit: 2022-12-14 | Payer: Managed Care, Other (non HMO) | Source: Ambulatory Visit

## 2022-12-14 ENCOUNTER — Ambulatory Visit: Payer: Managed Care, Other (non HMO)

## 2022-12-14 NOTE — Telephone Encounter (Signed)
Please review.  KP

## 2022-12-21 ENCOUNTER — Ambulatory Visit (INDEPENDENT_AMBULATORY_CARE_PROVIDER_SITE_OTHER): Payer: Managed Care, Other (non HMO)

## 2022-12-21 DIAGNOSIS — Z23 Encounter for immunization: Secondary | ICD-10-CM

## 2023-01-23 ENCOUNTER — Ambulatory Visit: Payer: Managed Care, Other (non HMO) | Admitting: Internal Medicine

## 2023-01-23 VITALS — BP 116/62 | HR 62 | Ht 63.0 in | Wt 104.6 lb

## 2023-01-23 DIAGNOSIS — R202 Paresthesia of skin: Secondary | ICD-10-CM

## 2023-01-23 DIAGNOSIS — R200 Anesthesia of skin: Secondary | ICD-10-CM | POA: Diagnosis not present

## 2023-01-23 DIAGNOSIS — G43009 Migraine without aura, not intractable, without status migrainosus: Secondary | ICD-10-CM | POA: Diagnosis not present

## 2023-01-23 MED ORDER — CYCLOBENZAPRINE HCL 5 MG PO TABS: 5.0000 mg | ORAL_TABLET | Freq: Every day | ORAL | 0 refills | Status: DC

## 2023-01-23 NOTE — Progress Notes (Signed)
Date:  01/23/2023   Name:  Kristi Murphy   DOB:  29-Mar-1960   MRN:  [id number]   Chief Complaint: Numbness (Numbness in left arm and left hand at bedtime. Started x 1 week ago. She wakes up in the middle night with tingling and numbness into hand.)  Tingling - in right arm once but now in left arm.  Onset during the night - wakes her from sleep.  She has to move around to relieve the tingling and discomfort.  No weakness noted. No neck or shoulder pain.  She is a side sleeper.  She tried tylenol last night but no change.  Migraine  This is a recurrent problem. The pain quality is similar to prior headaches. Pertinent negatives include no dizziness, fever or neck pain. She has tried triptans (work well if she takes it quickly with tylenol) for the symptoms. The treatment provided significant relief.    Review of Systems  Constitutional:  Negative for appetite change, fatigue and fever.  Respiratory:  Negative for chest tightness and shortness of breath.   Cardiovascular:  Negative for chest pain and palpitations.  Musculoskeletal:  Positive for myalgias. Negative for arthralgias, neck pain and neck stiffness.  Neurological:  Negative for dizziness, light-headedness and headaches.  Psychiatric/Behavioral:  Positive for sleep disturbance. Negative for dysphoric mood. The patient is not nervous/anxious.      Lab Results  Component Value Date   NA 140 06/15/2022   K 4.1 06/15/2022   CO2 23 06/15/2022   GLUCOSE 89 06/15/2022   BUN 21 06/15/2022   CREATININE 0.89 06/15/2022   CALCIUM 9.3 06/15/2022   EGFR 73 06/15/2022   GFRNONAA 80 04/30/2019   Lab Results  Component Value Date   CHOL 159 06/15/2022   HDL 64 06/15/2022   LDLCALC 86 06/15/2022   TRIG 43 06/15/2022   CHOLHDL 2.5 06/15/2022   Lab Results  Component Value Date   TSH 4.610 (H) 06/15/2022   Lab Results  Component Value Date   HGBA1C 5.6 06/15/2022   Lab Results  Component Value Date   WBC 4.1 06/15/2022    HGB 12.7 06/15/2022   HCT 37.9 06/15/2022   MCV 93 06/15/2022   PLT 259 06/15/2022   Lab Results  Component Value Date   ALT 18 06/15/2022   AST 22 06/15/2022   ALKPHOS 97 06/15/2022   BILITOT 0.4 06/15/2022   No results found for: "25OHVITD2", "25OHVITD3", "VD25OH"   Patient Active Problem List   Diagnosis Date Noted   Ganglion of right ankle 04/06/2017   Prediabetes 10/04/2016   Hx of melanoma of skin 10/04/2016   Migraine without aura and responsive to treatment 10/04/2016   Environmental and seasonal allergies 10/04/2016   Primary osteoarthritis of left knee 10/04/2016    Allergies  Allergen Reactions   Clindamycin/Lincomycin Hives    Past Surgical History:  Procedure Laterality Date   ANKLE SURGERY     fracture   COLONOSCOPY  01/15/2012   normal - 10 yr follow up   COLONOSCOPY WITH PROPOFOL N/A 01/05/2022   Procedure: COLONOSCOPY WITH PROPOFOL;  Surgeon: Wyline Mood, MD;  Location: [facility] ENDOSCOPY;  Service: Gastroenterology;  Laterality: N/A;   melanoma Right 2011   removal-    MENISCUS REPAIR Left 01/2016    Social History   Tobacco Use   Smoking status: Never   Smokeless tobacco: Never  Vaping Use   Vaping status: Never Used  Substance Use Topics   Alcohol use: No  Drug use: No     Medication list has been reviewed and updated.  Current Meds  Medication Sig   Cholecalciferol (VITAMIN D3) 10 MCG (400 UNIT) CAPS Take 1 capsule by mouth daily.   cyclobenzaprine (FLEXERIL) 5 MG tablet Take 1 tablet (5 mg total) by mouth at bedtime.   fluticasone (FLONASE) 50 MCG/ACT nasal spray SPRAY 2 SPRAYS INTO EACH NOSTRIL EVERY DAY   meloxicam (MOBIC) 7.5 MG tablet Take 7.5 mg by mouth daily as needed.   Multiple Vitamin (MULTIVITAMIN) tablet Take 1 tablet by mouth daily.   Probiotic Product (PROBIOTIC PO) Take by mouth.   rizatriptan (MAXALT) 10 MG tablet TAKE 1 TABLET BY MOUTH AS NEEDED FOR MIGRAINE. MAY REPEAT IN 2 HOURS IF NEEDED   tretinoin (RETIN-A) 0.1  % cream Apply topically at bedtime. Given by patients Dermatologist. Add IN Medication. No refill.       01/23/2023   11:25 AM 06/15/2022    8:05 AM 06/09/2021    8:25 AM 02/17/2021    1:57 PM  GAD 7 : Generalized Anxiety Score  Nervous, Anxious, on Edge 0 0 0 0  Control/stop worrying 0 0 0 0  Worry too much - different things 0 0 0 0  Trouble relaxing 0 0 0 0  Restless 0 0 0 0  Easily annoyed or irritable 0 0 0 0  Afraid - awful might happen 0 0 0 0  Total GAD 7 Score 0 0 0 0  Anxiety Difficulty Not difficult at all Not difficult at all Not difficult at all Not difficult at all       01/23/2023   11:24 AM 06/15/2022    8:05 AM 06/09/2021    8:25 AM  Depression screen PHQ 2/9  Decreased Interest 0 0 0  Down, Depressed, Hopeless 0 0 0  PHQ - 2 Score 0 0 0  Altered sleeping 0 0 0  Tired, decreased energy 0 0 0  Change in appetite 0 0 0  Feeling bad or failure about yourself  0 0 0  Trouble concentrating 0 0 0  Moving slowly or fidgety/restless 0 0 0  Suicidal thoughts 0 0 0  PHQ-9 Score 0 0 0  Difficult doing work/chores Not difficult at all Not difficult at all Not difficult at all    BP Readings from Last 3 Encounters:  01/23/23 116/62  06/15/22 (!) 100/58  01/05/22 91/71    Physical Exam Vitals and nursing note reviewed.  Constitutional:      General: She is not in acute distress.    Appearance: She is well-developed.  HENT:     Head: Normocephalic and atraumatic.  Pulmonary:     Effort: Pulmonary effort is normal. No respiratory distress.  Musculoskeletal:     Left shoulder: Normal.     Left elbow: Normal.     Left wrist: Normal.     Cervical back: Normal and normal range of motion. No spasms or tenderness. Normal range of motion.     Comments: Negative Tinel and Phalen signs on left Grip 5/5  Lymphadenopathy:     Cervical: No cervical adenopathy.  Skin:    General: Skin is warm and dry.     Findings: No rash.  Neurological:     Mental Status: She is  alert and oriented to person, place, and time.  Psychiatric:        Mood and Affect: Mood normal.        Behavior: Behavior normal.  Wt Readings from Last 3 Encounters:  01/23/23 104 lb 9.6 oz (47.4 kg)  06/15/22 104 lb 4.8 oz (47.3 kg)  01/05/22 102 lb 3.3 oz (46.4 kg)    BP 116/62   Pulse 62   Ht 5\' 3"  (1.6 m)   Wt 104 lb 9.6 oz (47.4 kg)   SpO2 100%   BMI 18.53 kg/m   Assessment and Plan:  Problem List Items Addressed This Visit       Unprioritized   Migraine without aura and responsive to treatment (Chronic)   Doing well with prn Maxalt taken immediately at HA onset      Relevant Medications   cyclobenzaprine (FLEXERIL) 5 MG tablet   Other Visit Diagnoses       Numbness and tingling in left arm    -  Primary   suspect nerve impingement from sleep position positioning suggestions given use heat as needed;  Add Flexeril and NSAID at HS   Relevant Medications   cyclobenzaprine (FLEXERIL) 5 MG tablet       Return in about 5 months (around 06/23/2023) for CPX.    Reubin Milan, MD [facility] Medicine [place]

## 2023-01-23 NOTE — Assessment & Plan Note (Signed)
Doing well with prn Maxalt taken immediately at HA onset

## 2023-02-01 DIAGNOSIS — Z170 Estrogen receptor positive status [ER+]: Secondary | ICD-10-CM

## 2023-02-01 HISTORY — DX: Estrogen receptor positive status (ER+): Z17.0

## 2023-02-09 ENCOUNTER — Ambulatory Visit
Admission: RE | Admit: 2023-02-09 | Discharge: 2023-02-09 | Disposition: A | Discharge status: Home / Self Care | Payer: Managed Care, Other (non HMO) | Source: Ambulatory Visit | Attending: Internal Medicine | Admitting: Internal Medicine

## 2023-02-09 ENCOUNTER — Other Ambulatory Visit: Payer: Self-pay | Admitting: Internal Medicine

## 2023-02-09 DIAGNOSIS — R928 Other abnormal and inconclusive findings on diagnostic imaging of breast: Secondary | ICD-10-CM | POA: Insufficient documentation

## 2023-02-09 DIAGNOSIS — N63 Unspecified lump in unspecified breast: Secondary | ICD-10-CM

## 2023-02-20 ENCOUNTER — Ambulatory Visit
Admission: RE | Admit: 2023-02-20 | Discharge: 2023-02-20 | Disposition: A | Discharge status: Home / Self Care | Payer: Managed Care, Other (non HMO) | Source: Ambulatory Visit | Attending: Internal Medicine | Admitting: Internal Medicine

## 2023-02-20 DIAGNOSIS — Z170 Estrogen receptor positive status [ER+]: Secondary | ICD-10-CM | POA: Insufficient documentation

## 2023-02-20 DIAGNOSIS — C50411 Malignant neoplasm of upper-outer quadrant of right female breast: Secondary | ICD-10-CM | POA: Insufficient documentation

## 2023-02-20 DIAGNOSIS — R928 Other abnormal and inconclusive findings on diagnostic imaging of breast: Secondary | ICD-10-CM | POA: Insufficient documentation

## 2023-02-20 DIAGNOSIS — N63 Unspecified lump in unspecified breast: Secondary | ICD-10-CM | POA: Diagnosis present

## 2023-02-20 HISTORY — PX: BREAST BIOPSY: SHX20

## 2023-02-22 ENCOUNTER — Encounter: Payer: Self-pay | Admitting: *Deleted

## 2023-02-22 LAB — SURGICAL PATHOLOGY

## 2023-02-22 NOTE — Progress Notes (Signed)
Received referral for newly diagnosed breast cancer from [facility].  Navigation initiated.  She is going to decide who she would like to see and give me a call back

## 2023-02-24 ENCOUNTER — Encounter: Payer: Self-pay | Admitting: Internal Medicine

## 2023-02-27 ENCOUNTER — Encounter: Payer: Self-pay | Admitting: *Deleted

## 2023-02-27 NOTE — Progress Notes (Signed)
Kristi Murphy is scheduled with [facility].  Nothing further needed.

## 2023-03-08 NOTE — Telephone Encounter (Signed)
 Sent patient my chart message for follow up and relaying message.  - Yechiel Erny

## 2023-03-20 LAB — COMPREHENSIVE METABOLIC PANEL WITH GFR
Calcium: 9.6 (ref 8.7–10.7)
eGFR: 82

## 2023-03-20 LAB — HEMOGLOBIN A1C: Hemoglobin A1C: 5.6

## 2023-03-20 LAB — HEPATIC FUNCTION PANEL
ALT: 23 U/L (ref 7–35)
AST: 27 (ref 13–35)
Bilirubin, Total: 0.8

## 2023-03-20 LAB — CBC AND DIFFERENTIAL
HCT: 42 (ref 36–46)
Hemoglobin: 13.4 (ref 12.0–16.0)
Platelets: 254 10*3/uL (ref 150–400)
WBC: 5.2

## 2023-03-20 LAB — BASIC METABOLIC PANEL WITH GFR
BUN: 25 — AB (ref 4–21)
CO2: 27 — AB (ref 13–22)
Chloride: 103 (ref 99–108)
Creatinine: 0.8 (ref 0.5–1.1)
Glucose: 85
Potassium: 4.2 meq/L (ref 3.5–5.1)
Sodium: 138 (ref 137–147)

## 2023-03-20 LAB — TSH: TSH: 3.89 (ref 0.41–5.90)

## 2023-05-05 ENCOUNTER — Encounter: Payer: Self-pay | Admitting: Internal Medicine

## 2023-05-17 ENCOUNTER — Ambulatory Visit (INDEPENDENT_AMBULATORY_CARE_PROVIDER_SITE_OTHER)

## 2023-05-17 DIAGNOSIS — Z23 Encounter for immunization: Secondary | ICD-10-CM | POA: Diagnosis not present

## 2023-05-17 NOTE — Progress Notes (Signed)
 Patient is in office today for a nurse visit for Immunization. Patient Injection was given in the  Left deltoid. Patient tolerated injection well.

## 2023-06-15 LAB — HM DEXA SCAN

## 2023-06-16 ENCOUNTER — Encounter: Payer: Self-pay | Admitting: Internal Medicine

## 2023-06-16 ENCOUNTER — Other Ambulatory Visit (HOSPITAL_COMMUNITY)
Admission: RE | Admit: 2023-06-16 | Discharge: 2023-06-16 | Disposition: A | Discharge status: Home / Self Care | Source: Ambulatory Visit | Attending: Internal Medicine | Admitting: Internal Medicine

## 2023-06-16 ENCOUNTER — Ambulatory Visit (INDEPENDENT_AMBULATORY_CARE_PROVIDER_SITE_OTHER): Payer: Self-pay | Admitting: Internal Medicine

## 2023-06-16 VITALS — BP 98/64 | HR 64 | Ht 63.0 in | Wt 101.1 lb

## 2023-06-16 DIAGNOSIS — R7303 Prediabetes: Secondary | ICD-10-CM | POA: Diagnosis not present

## 2023-06-16 DIAGNOSIS — M170 Bilateral primary osteoarthritis of knee: Secondary | ICD-10-CM | POA: Insufficient documentation

## 2023-06-16 DIAGNOSIS — Z124 Encounter for screening for malignant neoplasm of cervix: Secondary | ICD-10-CM | POA: Diagnosis present

## 2023-06-16 DIAGNOSIS — C50411 Malignant neoplasm of upper-outer quadrant of right female breast: Secondary | ICD-10-CM

## 2023-06-16 DIAGNOSIS — Z0000 Encounter for general adult medical examination without abnormal findings: Secondary | ICD-10-CM | POA: Diagnosis not present

## 2023-06-16 DIAGNOSIS — Z170 Estrogen receptor positive status [ER+]: Secondary | ICD-10-CM

## 2023-06-16 DIAGNOSIS — K220 Achalasia of cardia: Secondary | ICD-10-CM | POA: Insufficient documentation

## 2023-06-16 DIAGNOSIS — G43009 Migraine without aura, not intractable, without status migrainosus: Secondary | ICD-10-CM

## 2023-06-16 DIAGNOSIS — G43C0 Periodic headache syndromes in child or adult, not intractable: Secondary | ICD-10-CM

## 2023-06-16 MED ORDER — MELOXICAM 7.5 MG PO TABS: 7.5000 mg | ORAL_TABLET | Freq: Every day | ORAL | 0 refills | Status: DC | PRN

## 2023-06-16 NOTE — Assessment & Plan Note (Addendum)
 Recent A1C was normal. Continue healthy diet.

## 2023-06-16 NOTE — Assessment & Plan Note (Signed)
 Uses Mobic prn. Will refill.

## 2023-06-16 NOTE — Assessment & Plan Note (Signed)
 No recent change in migraine headaches. Headaches respond well to current therapy with maxalt. Will continue regimen;  follow up if worsening.

## 2023-06-16 NOTE — Assessment & Plan Note (Signed)
 Occasional symptoms suggestive of this. No gerd, weight loss or other red flag s/s Monitor for worsening

## 2023-06-16 NOTE — Assessment & Plan Note (Signed)
 Doing well s/p mastectomy. Now on Femara without side effects.

## 2023-06-16 NOTE — Progress Notes (Signed)
 Date:  06/16/2023   Name:  Kristi Murphy   DOB:  Jun 06, 1960   MRN:  [id number]   Chief Complaint: Medication Refill (Meloxicam ) and Annual Exam Kristi Murphy is a 63 y.o. female who presents today for her Complete Annual Exam. She feels well. She reports exercising aerobic, walks, weight lifting, 3 - 4 times a week. She reports she is sleeping fairly well. Breast complaints none.  Health Maintenance  Topic Date Due   HIV Screening  Never done   Pap with HPV screening  04/17/2023   COVID-19 Vaccine (9 - 2024-25 season) 07/24/2023   Flu Shot  09/01/2023   Mammogram  12/05/2023   Colon Cancer Screening  01/06/2032   DTaP/Tdap/Td vaccine (2 - Td or Tdap) 05/16/2033   Hepatitis C Screening  Completed   Zoster (Shingles) Vaccine  Completed   HPV Vaccine  Aged Out   Meningitis B Vaccine  Aged Out   Swallowing issues - several episodes of sensation of food sticking in upper esophagus.  She will walk around and the food will pass - she can then resume eating.  No gerd or abdominal pain.  Migraine  This is a recurrent problem. The problem has been unchanged. Pertinent negatives include no abdominal pain, coughing, dizziness or weakness. She has tried triptans for the symptoms.  Knee Pain  The pain is present in the left knee and right knee. The quality of the pain is described as aching. The pain is mild. The pain has been Fluctuating since onset. She has tried NSAIDs for the symptoms. The treatment provided significant relief.    Review of Systems  Constitutional:  Negative for fatigue and unexpected weight change.  HENT:  Positive for trouble swallowing.   Eyes:  Negative for visual disturbance.  Respiratory:  Negative for cough, chest tightness, shortness of breath and wheezing.   Cardiovascular:  Negative for chest pain, palpitations and leg swelling.  Gastrointestinal:  Negative for abdominal pain, constipation and diarrhea.  Musculoskeletal:  Positive for arthralgias. Negative for  myalgias.  Neurological:  Positive for headaches. Negative for dizziness, weakness and light-headedness.  Psychiatric/Behavioral:  Negative for dysphoric mood and sleep disturbance. The patient is not nervous/anxious and is not hyperactive.      Lab Results  Component Value Date   NA 138 03/20/2023   K 4.2 03/20/2023   CO2 27 (A) 03/20/2023   GLUCOSE 89 06/15/2022   BUN 25 (A) 03/20/2023   CREATININE 0.8 03/20/2023   CALCIUM 9.6 03/20/2023   EGFR 82 03/20/2023   GFRNONAA 80 04/30/2019   Lab Results  Component Value Date   CHOL 159 06/15/2022   HDL 64 06/15/2022   LDLCALC 86 06/15/2022   TRIG 43 06/15/2022   CHOLHDL 2.5 06/15/2022   Lab Results  Component Value Date   TSH 3.89 03/20/2023   Lab Results  Component Value Date   HGBA1C 5.6 03/20/2023   Lab Results  Component Value Date   WBC 5.2 03/20/2023   HGB 13.4 03/20/2023   HCT 42 03/20/2023   MCV 93 06/15/2022   PLT 254 03/20/2023   Lab Results  Component Value Date   ALT 23 03/20/2023   AST 27 03/20/2023   ALKPHOS 97 06/15/2022   BILITOT 0.4 06/15/2022   No results found for: "25OHVITD2", "25OHVITD3", "VD25OH"   Patient Active Problem List   Diagnosis Date Noted   Primary osteoarthritis of both knees 06/16/2023   Achalasia of esophagus 06/16/2023   Malignant neoplasm of upper-outer  quadrant of right breast in female, estrogen receptor positive (HCC) 02/01/2023   Ganglion of right ankle 04/06/2017   Prediabetes 10/04/2016   Hx of melanoma of skin 10/04/2016   Migraine without aura and responsive to treatment 10/04/2016   Environmental and seasonal allergies 10/04/2016    Allergies  Allergen Reactions   Clindamycin/Lincomycin Hives    Past Surgical History:  Procedure Laterality Date   ANKLE SURGERY     fracture   BREAST BIOPSY Right 02/20/2023   us  biopsy/ribbon clip/path pending   BREAST BIOPSY Right 02/20/2023   US  RT BREAST BX W LOC DEV 1ST LESION IMG BX SPEC US  GUIDE 02/20/2023  [facility]   COLONOSCOPY  01/15/2012   normal - 10 yr follow up   COLONOSCOPY WITH PROPOFOL  N/A 01/05/2022   Procedure: COLONOSCOPY WITH PROPOFOL ;  Surgeon: Luke Salaam, MD;  Location: [facility] ENDOSCOPY;  Service: Gastroenterology;  Laterality: N/A;   melanoma Right 2011   removal-    MENISCUS REPAIR Left 01/2016    Social History   Tobacco Use   Smoking status: Never   Smokeless tobacco: Never  Vaping Use   Vaping status: Never Used  Substance Use Topics   Alcohol use: No   Drug use: No     Medication list has been reviewed and updated.  Current Meds  Medication Sig   Calcium Citrate-Vitamin D3 1000-0.01 MG/30ML LIQD    fluticasone  (FLONASE ) 50 MCG/ACT nasal spray SPRAY 2 SPRAYS INTO EACH NOSTRIL EVERY DAY   letrozole (FEMARA) 2.5 MG tablet Take 2.5 mg by mouth daily.   Multiple Vitamin (MULTIVITAMIN) tablet Take 1 tablet by mouth daily.   Probiotic Product (PROBIOTIC PO) Take by mouth.   rizatriptan  (MAXALT ) 10 MG tablet TAKE 1 TABLET BY MOUTH AS NEEDED FOR MIGRAINE. MAY REPEAT IN 2 HOURS IF NEEDED   tretinoin  (RETIN-A ) 0.1 % cream Apply topically at bedtime. Given by patients Dermatologist. Add IN Medication. No refill.   [DISCONTINUED] meloxicam (MOBIC) 7.5 MG tablet Take 7.5 mg by mouth daily as needed.       06/16/2023    8:28 AM 01/23/2023   11:25 AM 06/15/2022    8:05 AM 06/09/2021    8:25 AM  GAD 7 : Generalized Anxiety Score  Nervous, Anxious, on Edge 0 0 0 0  Control/stop worrying 0 0 0 0  Worry too much - different things 0 0 0 0  Trouble relaxing 0 0 0 0  Restless 0 0 0 0  Easily annoyed or irritable 0 0 0 0  Afraid - awful might happen 0 0 0 0  Total GAD 7 Score 0 0 0 0  Anxiety Difficulty Not difficult at all Not difficult at all Not difficult at all Not difficult at all       06/16/2023    8:28 AM 01/23/2023   11:24 AM 06/15/2022    8:05 AM  Depression screen PHQ 2/9  Decreased Interest 0 0 0  Down, Depressed, Hopeless 0 0 0  PHQ - 2 Score  0 0 0  Altered sleeping 2 0 0  Tired, decreased energy 1 0 0  Change in appetite 0 0 0  Feeling bad or failure about yourself  0 0 0  Trouble concentrating 0 0 0  Moving slowly or fidgety/restless 0 0 0  Suicidal thoughts 0 0 0  PHQ-9 Score 3 0 0  Difficult doing work/chores Not difficult at all Not difficult at all Not difficult at all    BP Readings from Last  3 Encounters:  06/16/23 98/64  01/23/23 116/62  06/15/22 (!) 100/58    Physical Exam Vitals and nursing note reviewed.  Constitutional:      General: She is not in acute distress.    Appearance: She is well-developed.  HENT:     Head: Normocephalic and atraumatic.     Right Ear: Tympanic membrane and ear canal normal.     Left Ear: Tympanic membrane and ear canal normal.     Nose:     Right Sinus: No maxillary sinus tenderness.     Left Sinus: No maxillary sinus tenderness.  Eyes:     General: No scleral icterus.       Right eye: No discharge.        Left eye: No discharge.     Conjunctiva/sclera: Conjunctivae normal.  Neck:     Thyroid: No thyromegaly.     Vascular: No carotid bruit.  Cardiovascular:     Rate and Rhythm: Normal rate and regular rhythm.     Pulses: Normal pulses.     Heart sounds: Normal heart sounds.  Pulmonary:     Effort: Pulmonary effort is normal. No respiratory distress.     Breath sounds: No wheezing.  Chest:  Breasts:    Right: Absent.     Left: No mass, nipple discharge, skin change or tenderness.  Abdominal:     General: Bowel sounds are normal.     Palpations: Abdomen is soft.     Tenderness: There is no abdominal tenderness.  Genitourinary:    Labia:        Right: No tenderness, lesion or injury.        Left: No tenderness, lesion or injury.      Vagina: No vaginal discharge or erythema.     Cervix: Normal.     Uterus: Normal.      Adnexa: Right adnexa normal and left adnexa normal.     Comments: Pap obtained Musculoskeletal:     Cervical back: Normal range of motion.  No erythema.     Right knee: Deformity present. No effusion. Normal range of motion.     Left knee: Deformity present. No effusion. Normal range of motion.     Right lower leg: No edema.     Left lower leg: No edema.  Lymphadenopathy:     Cervical: No cervical adenopathy.  Skin:    General: Skin is warm and dry.     Capillary Refill: Capillary refill takes less than 2 seconds.     Findings: No rash.  Neurological:     Mental Status: She is alert and oriented to person, place, and time.     Cranial Nerves: No cranial nerve deficit.     Sensory: No sensory deficit.     Deep Tendon Reflexes: Reflexes are normal and symmetric.  Psychiatric:        Attention and Perception: Attention normal.        Mood and Affect: Mood normal.     Wt Readings from Last 3 Encounters:  06/16/23 101 lb 2 oz (45.9 kg)  01/23/23 104 lb 9.6 oz (47.4 kg)  06/15/22 104 lb 4.8 oz (47.3 kg)    BP 98/64   Pulse 64   Ht 5\' 3"  (1.6 m)   Wt 101 lb 2 oz (45.9 kg)   SpO2 98%   BMI 17.91 kg/m   Assessment and Plan:  Problem List Items Addressed This Visit       Unprioritized   Prediabetes (  Chronic)   Recent A1C was normal. Continue healthy diet.      Migraine without aura and responsive to treatment (Chronic)   No recent change in migraine headaches. Headaches respond well to current therapy with maxalt . Will continue regimen;  follow up if worsening.       Relevant Medications   meloxicam (MOBIC) 7.5 MG tablet   Malignant neoplasm of upper-outer quadrant of right breast in female, estrogen receptor positive (HCC)   Doing well s/p mastectomy. Now on Femara without side effects.      Primary osteoarthritis of both knees   Uses Mobic prn. Will refill.      Relevant Medications   meloxicam (MOBIC) 7.5 MG tablet   Achalasia of esophagus   Occasional symptoms suggestive of this. No gerd, weight loss or other red flag s/s Monitor for worsening      Other Visit Diagnoses       Annual  physical exam    -  Primary     Encounter for screening for cervical cancer       Relevant Orders   Cytology - PAP     Periodic headache syndrome, not intractable       Relevant Medications   meloxicam (MOBIC) 7.5 MG tablet       Return in about 1 year (around 06/15/2024) for CPX Dr. Cari Char.    Sheron Dixons, MD [facility] Medicine [place]

## 2023-06-19 ENCOUNTER — Ambulatory Visit: Payer: Self-pay | Admitting: Internal Medicine

## 2023-06-19 LAB — CYTOLOGY - PAP
Comment: NEGATIVE
Diagnosis: NEGATIVE
High risk HPV: NEGATIVE

## 2023-07-04 ENCOUNTER — Other Ambulatory Visit: Payer: Self-pay | Admitting: Internal Medicine

## 2023-07-04 DIAGNOSIS — G43C0 Periodic headache syndromes in child or adult, not intractable: Secondary | ICD-10-CM

## 2023-07-04 NOTE — Telephone Encounter (Signed)
 Requested Prescriptions  Pending Prescriptions Disp Refills   rizatriptan  (MAXALT ) 10 MG tablet [Pharmacy Med Name: RIZATRIPTAN  10 MG TABLET] 9 tablet 12    Sig: TAKE 1 TABLET BY MOUTH AS NEEDED FOR MIGRAINE. MAY REPEAT IN 2 HOURS IF NEEDED     Neurology:  Migraine Therapy - Triptan Passed - 07/04/2023  5:12 PM      Passed - Last BP in normal range    BP Readings from Last 1 Encounters:  06/16/23 98/64         Passed - Valid encounter within last 12 months    Recent Outpatient Visits           2 weeks ago Annual physical exam   [facility] at [facility], Chales [place], MD       Future Appointments             In 11 months Barnetta Liberty, MD [facility] Medicine at [facility], [facility]

## 2023-07-13 ENCOUNTER — Telehealth: Payer: Self-pay

## 2023-07-13 ENCOUNTER — Other Ambulatory Visit: Payer: Self-pay | Admitting: Internal Medicine

## 2023-07-13 DIAGNOSIS — M170 Bilateral primary osteoarthritis of knee: Secondary | ICD-10-CM

## 2023-07-14 NOTE — Telephone Encounter (Signed)
Please review medication refill  request

## 2023-07-14 NOTE — Telephone Encounter (Signed)
 Requested medication (s) are due for refill today: yes   Requested medication (s) are on the active medication list: yes   Last refill:  06/16/23 #30 0 refills   Future visit scheduled: yes 06/11/24  Notes to clinic:   last OV 06/16/23 no refills remain. Do you want to refill Rx?     Requested Prescriptions  Pending Prescriptions Disp Refills   meloxicam  (MOBIC ) 7.5 MG tablet [Pharmacy Med Name: MELOXICAM  7.5 MG TABLET] 30 tablet 0    Sig: TAKE 1 TABLET BY MOUTH DAILY AS NEEDED.     Analgesics:  COX2 Inhibitors Failed - 07/14/2023 12:39 PM      Failed - Manual Review: Labs are only required if the patient has taken medication for more than 8 weeks.      Passed - HGB in normal range and within 360 days    Hemoglobin  Date Value Ref Range Status  03/20/2023 13.4 12.0 - 16.0 Final  06/15/2022 12.7 11.1 - 15.9 g/dL Final         Passed - Cr in normal range and within 360 days    Creatinine  Date Value Ref Range Status  03/20/2023 0.8 0.5 - 1.1 Final   Creatinine, Ser  Date Value Ref Range Status  06/15/2022 0.89 0.57 - 1.00 mg/dL Final         Passed - HCT in normal range and within 360 days    HCT  Date Value Ref Range Status  03/20/2023 42 36 - 46 Final   Hematocrit  Date Value Ref Range Status  06/15/2022 37.9 34.0 - 46.6 % Final         Passed - AST in normal range and within 360 days    AST  Date Value Ref Range Status  03/20/2023 27 13 - 35 Final         Passed - ALT in normal range and within 360 days    ALT  Date Value Ref Range Status  03/20/2023 23 7 - 35 U/L Final         Passed - eGFR is 30 or above and within 360 days    GFR calc Af Amer  Date Value Ref Range Status  04/30/2019 93 >59 mL/min/1.73 Final   GFR calc non Af Amer  Date Value Ref Range Status  04/30/2019 80 >59 mL/min/1.73 Final   eGFR  Date Value Ref Range Status  03/20/2023 82  Final  06/15/2022 73 >59 mL/min/1.73 Final         Passed - Patient is not pregnant      Passed  - Valid encounter within last 12 months    Recent Outpatient Visits           4 weeks ago Annual physical exam   [facility] & [facility] Medicine at [facility], Chales [place], MD       Future Appointments             In 11 months Barnetta Liberty, MD [facility] Primary [facility] & [facility] Medicine at [facility], [facility]

## 2023-07-17 NOTE — Telephone Encounter (Signed)
 error

## 2023-07-24 DIAGNOSIS — M810 Age-related osteoporosis without current pathological fracture: Secondary | ICD-10-CM | POA: Insufficient documentation

## 2023-08-09 ENCOUNTER — Encounter: Payer: Self-pay | Admitting: Internal Medicine

## 2023-08-11 ENCOUNTER — Other Ambulatory Visit: Payer: Self-pay | Admitting: Internal Medicine

## 2023-08-11 DIAGNOSIS — M170 Bilateral primary osteoarthritis of knee: Secondary | ICD-10-CM

## 2023-08-12 NOTE — Telephone Encounter (Signed)
 Requested Prescriptions  Pending Prescriptions Disp Refills   meloxicam  (MOBIC ) 7.5 MG tablet [Pharmacy Med Name: MELOXICAM  7.5 MG TABLET] 30 tablet 0    Sig: TAKE 1 TABLET BY MOUTH DAILY AS NEEDED.     Analgesics:  COX2 Inhibitors Failed - 08/12/2023 12:44 PM      Failed - Manual Review: Labs are only required if the patient has taken medication for more than 8 weeks.      Passed - HGB in normal range and within 360 days    Hemoglobin  Date Value Ref Range Status  03/20/2023 13.4 12.0 - 16.0 Final  06/15/2022 12.7 11.1 - 15.9 g/dL Final         Passed - Cr in normal range and within 360 days    Creatinine  Date Value Ref Range Status  03/20/2023 0.8 0.5 - 1.1 Final   Creatinine, Ser  Date Value Ref Range Status  06/15/2022 0.89 0.57 - 1.00 mg/dL Final         Passed - HCT in normal range and within 360 days    HCT  Date Value Ref Range Status  03/20/2023 42 36 - 46 Final   Hematocrit  Date Value Ref Range Status  06/15/2022 37.9 34.0 - 46.6 % Final         Passed - AST in normal range and within 360 days    AST  Date Value Ref Range Status  03/20/2023 27 13 - 35 Final         Passed - ALT in normal range and within 360 days    ALT  Date Value Ref Range Status  03/20/2023 23 7 - 35 U/L Final         Passed - eGFR is 30 or above and within 360 days    GFR calc Af Amer  Date Value Ref Range Status  04/30/2019 93 >59 mL/min/1.73 Final   GFR calc non Af Amer  Date Value Ref Range Status  04/30/2019 80 >59 mL/min/1.73 Final   eGFR  Date Value Ref Range Status  03/20/2023 82  Final  06/15/2022 73 >59 mL/min/1.73 Final         Passed - Patient is not pregnant      Passed - Valid encounter within last 12 months    Recent Outpatient Visits           1 month ago Annual physical exam   [place] [facility] & [facility] Medicine at [facility], Leita DEL, MD       Future Appointments             In 10 months Lemon Raisin, MD [facility]  Primary Care & Sports Medicine at [facility], [facility]

## 2023-09-04 ENCOUNTER — Telehealth: Payer: Self-pay

## 2023-09-04 DIAGNOSIS — G43C0 Periodic headache syndromes in child or adult, not intractable: Secondary | ICD-10-CM

## 2023-09-04 MED ORDER — RIZATRIPTAN BENZOATE 10 MG PO TABS: 10.0000 mg | ORAL_TABLET | ORAL | 3 refills | Status: AC | PRN

## 2023-09-04 NOTE — Telephone Encounter (Signed)
 Copied from CRM #[id number]. Topic: Clinical - Medication Question >> Sep 04, 2023 11:27 AM Antwanette L wrote: Reason for CRM: Ann from [facility] is requesting a refill on rizatriptan  (MAXALT ) 10 MG tablet. Please send the refill sent to [facility]. [employer] customer service phone number is  [phone]

## 2023-09-05 NOTE — Telephone Encounter (Unsigned)
 Copied from CRM #[id number]. Topic: Clinical - Prescription Issue >> Sep 04, 2023  4:31 PM Wess RAMAN wrote: Reason for CRM: Jose from [facility] would like to submit a 90 day supply of rizatriptan  (MAXALT ) 10 MG tablet for patient.  Preferred Pharmacy: [facility] - [place], [place] - [address] [place] [address] Phone: [phone] Fax: [phone] Hours: Not open 24 hours

## 2023-09-05 NOTE — Telephone Encounter (Signed)
 Rx sent on 09/04/2023.

## 2023-09-10 ENCOUNTER — Other Ambulatory Visit: Payer: Self-pay | Admitting: Internal Medicine

## 2023-09-10 DIAGNOSIS — M170 Bilateral primary osteoarthritis of knee: Secondary | ICD-10-CM

## 2023-09-13 NOTE — Telephone Encounter (Signed)
 Requested Prescriptions  Pending Prescriptions Disp Refills   meloxicam  (MOBIC ) 7.5 MG tablet [Pharmacy Med Name: MELOXICAM  7.5 MG TABLET] 30 tablet 0    Sig: TAKE 1 TABLET BY MOUTH DAILY AS NEEDED.     Analgesics:  COX2 Inhibitors Failed - 09/13/2023  2:07 PM      Failed - Manual Review: Labs are only required if the patient has taken medication for more than 8 weeks.      Passed - HGB in normal range and within 360 days    Hemoglobin  Date Value Ref Range Status  03/20/2023 13.4 12.0 - 16.0 Final  06/15/2022 12.7 11.1 - 15.9 g/dL Final         Passed - Cr in normal range and within 360 days    Creatinine  Date Value Ref Range Status  03/20/2023 0.8 0.5 - 1.1 Final   Creatinine, Ser  Date Value Ref Range Status  06/15/2022 0.89 0.57 - 1.00 mg/dL Final         Passed - HCT in normal range and within 360 days    HCT  Date Value Ref Range Status  03/20/2023 42 36 - 46 Final   Hematocrit  Date Value Ref Range Status  06/15/2022 37.9 34.0 - 46.6 % Final         Passed - AST in normal range and within 360 days    AST  Date Value Ref Range Status  03/20/2023 27 13 - 35 Final         Passed - ALT in normal range and within 360 days    ALT  Date Value Ref Range Status  03/20/2023 23 7 - 35 U/L Final         Passed - eGFR is 30 or above and within 360 days    GFR calc Af Amer  Date Value Ref Range Status  04/30/2019 93 >59 mL/min/1.73 Final   GFR calc non Af Amer  Date Value Ref Range Status  04/30/2019 80 >59 mL/min/1.73 Final   eGFR  Date Value Ref Range Status  03/20/2023 82  Final  06/15/2022 73 >59 mL/min/1.73 Final         Passed - Patient is not pregnant      Passed - Valid encounter within last 12 months    Recent Outpatient Visits           2 months ago Annual physical exam   [facility], Leita DEL, MD       Future Appointments             In 9 months Lemon Raisin, MD [facility]  Primary Care & Sports Medicine at [facility], [facility]

## 2023-11-07 ENCOUNTER — Telehealth: Payer: Self-pay

## 2023-11-07 NOTE — Telephone Encounter (Signed)
 Copied from CRM [id number]. Topic: Clinical - Request for Lab/Test Order >> Nov 07, 2023 11:59 AM Myrick T wrote: Reason for CRM: patient called stated she need some necessary blood work. She needs to know what labs were not done. Please f/u with patient to discuss.

## 2023-11-20 ENCOUNTER — Ambulatory Visit: Admitting: Internal Medicine

## 2023-11-20 ENCOUNTER — Encounter: Payer: Self-pay | Admitting: Internal Medicine

## 2023-11-20 VITALS — BP 104/68 | HR 75 | Ht 63.0 in | Wt 104.0 lb

## 2023-11-20 DIAGNOSIS — R7303 Prediabetes: Secondary | ICD-10-CM | POA: Diagnosis not present

## 2023-11-20 DIAGNOSIS — Z13220 Encounter for screening for lipoid disorders: Secondary | ICD-10-CM | POA: Diagnosis not present

## 2023-11-20 DIAGNOSIS — M810 Age-related osteoporosis without current pathological fracture: Secondary | ICD-10-CM

## 2023-11-20 DIAGNOSIS — Z23 Encounter for immunization: Secondary | ICD-10-CM | POA: Diagnosis not present

## 2023-11-20 MED ORDER — COVID-19 MRNA VAC-TRIS(PFIZER) 30 MCG/0.3ML IM SUSY: 0.3000 mL | PREFILLED_SYRINGE | Freq: Once | INTRAMUSCULAR | 0 refills | Status: AC

## 2023-11-20 NOTE — Assessment & Plan Note (Signed)
 Now on Prolia - has had one injection. She continues on vitamin D but no level has been checked

## 2023-11-20 NOTE — Assessment & Plan Note (Signed)
 Lab Results  Component Value Date   HGBA1C 5.6 03/20/2023  Continue healthy diet.

## 2023-11-20 NOTE — Progress Notes (Signed)
 Date:  11/20/2023   Name:  Kristi Murphy   DOB:  05-Jul-1960   MRN:  [id number]   Chief Complaint: Hyperlipidemia (Wants these checked ) and Osteoporosis (Wants to discuss had bone density done- gets injections every 6 months prolia )  Hyperlipidemia This is a chronic problem. The problem is controlled. Recent lipid tests were reviewed and are variable. There are no known factors aggravating her hyperlipidemia. Pertinent negatives include no chest pain, myalgias or shortness of breath. The current treatment provides significant improvement of lipids.  OP - now on Prolia and taking calcium and vitamin D.  Has not had a vitamin D level done.   Review of Systems  Constitutional:  Negative for fatigue and unexpected weight change.  HENT:  Negative for trouble swallowing.   Eyes:  Negative for visual disturbance.  Respiratory:  Negative for cough, chest tightness, shortness of breath and wheezing.   Cardiovascular:  Negative for chest pain, palpitations and leg swelling.  Gastrointestinal:  Negative for abdominal pain, constipation and diarrhea.  Musculoskeletal:  Negative for arthralgias and myalgias.  Neurological:  Positive for headaches (mild headache for several days after Prolia). Negative for dizziness, weakness and light-headedness.  Psychiatric/Behavioral:  Negative for dysphoric mood. The patient is not nervous/anxious.      Lab Results  Component Value Date   NA 138 03/20/2023   K 4.2 03/20/2023   CO2 27 (A) 03/20/2023   GLUCOSE 89 06/15/2022   BUN 25 (A) 03/20/2023   CREATININE 0.8 03/20/2023   CALCIUM 9.6 03/20/2023   EGFR 82 03/20/2023   GFRNONAA 80 04/30/2019   Lab Results  Component Value Date   CHOL 159 06/15/2022   HDL 64 06/15/2022   LDLCALC 86 06/15/2022   TRIG 43 06/15/2022   CHOLHDL 2.5 06/15/2022   Lab Results  Component Value Date   TSH 3.89 03/20/2023   Lab Results  Component Value Date   HGBA1C 5.6 03/20/2023   Lab Results  Component Value  Date   WBC 5.2 03/20/2023   HGB 13.4 03/20/2023   HCT 42 03/20/2023   MCV 93 06/15/2022   PLT 254 03/20/2023   Lab Results  Component Value Date   ALT 23 03/20/2023   AST 27 03/20/2023   ALKPHOS 97 06/15/2022   BILITOT 0.4 06/15/2022   No results found for: MARIEN BOLLS, VD25OH   Patient Active Problem List   Diagnosis Date Noted   Osteoporosis of lumbar spine 07/24/2023   Primary osteoarthritis of both knees 06/16/2023   Achalasia of esophagus 06/16/2023   Malignant neoplasm of upper-outer quadrant of right breast in female, estrogen receptor positive (HCC) 02/01/2023   Ganglion of right ankle 04/06/2017   Prediabetes 10/04/2016   Hx of melanoma of skin 10/04/2016   Migraine without aura and responsive to treatment 10/04/2016   Environmental and seasonal allergies 10/04/2016    Allergies  Allergen Reactions   Clindamycin/Lincomycin Hives    Past Surgical History:  Procedure Laterality Date   ANKLE SURGERY     fracture   BREAST BIOPSY Right 02/20/2023   us  biopsy/ribbon clip/path pending   BREAST BIOPSY Right 02/20/2023   US  RT BREAST BX W LOC DEV 1ST LESION IMG BX SPEC US  GUIDE 02/20/2023 [facility]   COLONOSCOPY  01/15/2012   normal - 10 yr follow up   COLONOSCOPY WITH PROPOFOL  N/A 01/05/2022   Procedure: COLONOSCOPY WITH PROPOFOL ;  Surgeon: Therisa Bi, MD;  Location: [facility];  Service: Gastroenterology;  Laterality: N/A;   COSMETIC  SURGERY  1998   Lip cancer removal   melanoma Right 2011   removal-    MENISCUS REPAIR Left 01/2016    Social History   Tobacco Use   Smoking status: Never   Smokeless tobacco: Never  Vaping Use   Vaping status: Never Used  Substance Use Topics   Alcohol use: No   Drug use: No     Medication list has been reviewed and updated.  Current Meds  Medication Sig   Calcium Citrate-Vitamin D3 1000-0.01 MG/30ML LIQD    COVID-19 mRNA vaccine, Pfizer, (COMIRNATY) syringe Inject 0.3 mLs into the  muscle once for 1 dose.   denosumab (PROLIA) 60 MG/ML SOSY injection Inject 60 mg into the skin every 6 (six) months.   fluticasone  (FLONASE ) 50 MCG/ACT nasal spray SPRAY 2 SPRAYS INTO EACH NOSTRIL EVERY DAY   hydrocortisone 2.5 % cream Apply topically.   letrozole (FEMARA) 2.5 MG tablet Take 2.5 mg by mouth daily.   meloxicam  (MOBIC ) 7.5 MG tablet TAKE 1 TABLET BY MOUTH DAILY AS NEEDED.   Multiple Vitamin (MULTIVITAMIN) tablet Take 1 tablet by mouth daily.   Probiotic Product (PROBIOTIC PO) Take by mouth.   rizatriptan  (MAXALT ) 10 MG tablet Take 1 tablet (10 mg total) by mouth as needed for migraine. May repeat in 2 hours if neededTAKE 1 TABLET BY MOUTH AS NEEDED FOR MIGRAINE. MAY REPEAT IN 2 HOURS IF NEEDED   SODIUM FLUORIDE 5000 PPM 1.1 % PSTE PLEASE SEE ATTACHED FOR DETAILED DIRECTIONS   tretinoin  (RETIN-A ) 0.1 % cream Apply topically at bedtime. Given by patients Dermatologist. Add IN Medication. No refill.       11/20/2023    9:41 AM 06/16/2023    8:28 AM 01/23/2023   11:25 AM 06/15/2022    8:05 AM  GAD 7 : Generalized Anxiety Score  Nervous, Anxious, on Edge 0 0 0 0  Control/stop worrying 0 0 0 0  Worry too much - different things 0 0 0 0  Trouble relaxing 0 0 0 0  Restless 0 0 0 0  Easily annoyed or irritable 0 0 0 0  Afraid - awful might happen 0 0 0 0  Total GAD 7 Score 0 0 0 0  Anxiety Difficulty Not difficult at all Not difficult at all Not difficult at all Not difficult at all       11/20/2023    9:41 AM 06/16/2023    8:28 AM 01/23/2023   11:24 AM  Depression screen PHQ 2/9  Decreased Interest 0 0 0  Down, Depressed, Hopeless 0 0 0  PHQ - 2 Score 0 0 0  Altered sleeping  2 0  Tired, decreased energy  1 0  Change in appetite  0 0  Feeling bad or failure about yourself   0 0  Trouble concentrating  0 0  Moving slowly or fidgety/restless  0 0  Suicidal thoughts  0 0  PHQ-9 Score  3 0  Difficult doing work/chores  Not difficult at all Not difficult at all     BP Readings from Last 3 Encounters:  11/20/23 104/68  06/16/23 98/64  01/23/23 116/62    Physical Exam Vitals and nursing note reviewed.  Constitutional:      General: She is not in acute distress.    Appearance: Normal appearance. She is well-developed.  HENT:     Head: Normocephalic and atraumatic.  Neck:     Vascular: No carotid bruit.  Cardiovascular:     Rate and Rhythm:  Normal rate and regular rhythm.  Pulmonary:     Effort: Pulmonary effort is normal. No respiratory distress.     Breath sounds: No wheezing or rhonchi.  Musculoskeletal:     Cervical back: Normal range of motion.     Right lower leg: No edema.     Left lower leg: No edema.  Lymphadenopathy:     Cervical: No cervical adenopathy.  Skin:    General: Skin is warm and dry.     Findings: No rash.  Neurological:     Mental Status: She is alert and oriented to person, place, and time.  Psychiatric:        Mood and Affect: Mood normal.        Behavior: Behavior normal.     Wt Readings from Last 3 Encounters:  11/20/23 104 lb (47.2 kg)  06/16/23 101 lb 2 oz (45.9 kg)  01/23/23 104 lb 9.6 oz (47.4 kg)    BP 104/68   Pulse 75   Ht 5' 3 (1.6 m)   Wt 104 lb (47.2 kg)   SpO2 98%   BMI 18.42 kg/m   Assessment and Plan:  Problem List Items Addressed This Visit       Unprioritized   Prediabetes (Chronic)   Lab Results  Component Value Date   HGBA1C 5.6 03/20/2023  Continue healthy diet.       Relevant Medications   COVID-19 mRNA vaccine, Pfizer, (COMIRNATY) syringe   Osteoporosis of lumbar spine - Primary   Now on Prolia - has had one injection. She continues on vitamin D but no level has been checked      Relevant Medications   denosumab (PROLIA) 60 MG/ML SOSY injection   Other Relevant Orders   VITAMIN D 25 Hydroxy (Vit-D Deficiency, Fractures)   Other Visit Diagnoses       Screening for lipid disorders       currently well controlled with diet and exercise   Relevant  Orders   Lipid panel     Encounter for immunization       Relevant Orders   Flu vaccine trivalent PF, 6mos and older(Flulaval,Afluria,Fluarix,Fluzone) (Completed)       No follow-ups on file.    Leita HILARIO Adie, MD [facility] [place]

## 2023-11-25 LAB — LIPID PANEL
Chol/HDL Ratio: 2.6 ratio (ref 0.0–4.4)
Cholesterol, Total: 158 mg/dL (ref 100–199)
HDL: 60 mg/dL (ref >39)
LDL Chol Calc (NIH): 89 mg/dL (ref 0–99)
Triglycerides: 42 mg/dL (ref 0–149)
VLDL Cholesterol Cal: 9 mg/dL (ref 5–40)

## 2023-11-25 LAB — VITAMIN D 25 HYDROXY (VIT D DEFICIENCY, FRACTURES): Vit D, 25-Hydroxy: 82.9 ng/mL (ref 30.0–100.0)

## 2023-11-26 ENCOUNTER — Ambulatory Visit: Payer: Self-pay | Admitting: Internal Medicine

## 2024-02-08 ENCOUNTER — Encounter: Payer: Self-pay | Admitting: Student

## 2024-02-08 NOTE — Telephone Encounter (Signed)
 Please review patient's message:

## 2024-02-08 NOTE — Telephone Encounter (Signed)
 Please review message

## 2024-02-08 NOTE — Telephone Encounter (Signed)
 Please review patient's response.

## 2024-02-09 NOTE — Telephone Encounter (Signed)
 fyi

## 2024-03-04 ENCOUNTER — Encounter: Payer: Self-pay | Admitting: Student

## 2024-03-05 NOTE — Telephone Encounter (Signed)
 Please review.  KP

## 2024-06-11 ENCOUNTER — Encounter: Admitting: Student

## 2024-06-18 ENCOUNTER — Encounter: Admitting: Student
